# Patient Record
Sex: Female | Born: 1935 | Race: White | Hispanic: No | Smoking: Never smoker
Health system: Southern US, Community
[De-identification: ages and names within clinical notes are randomized; demographics above are authoritative.]

## PROBLEM LIST (undated history)

## (undated) DIAGNOSIS — C50919 Malignant neoplasm of unspecified site of unspecified female breast: Secondary | ICD-10-CM

## (undated) DIAGNOSIS — I10 Essential (primary) hypertension: Secondary | ICD-10-CM

## (undated) HISTORY — PX: BREAST LUMPECTOMY: SHX2

---

## 1978-09-04 HISTORY — PX: MASTECTOMY: SHX3

## 1998-01-18 ENCOUNTER — Other Ambulatory Visit: Admission: RE | Admit: 1998-01-18 | Discharge: 1998-01-18 | Payer: Self-pay | Admitting: Family Medicine

## 2005-02-22 ENCOUNTER — Ambulatory Visit: Payer: Self-pay | Admitting: Internal Medicine

## 2005-04-05 ENCOUNTER — Other Ambulatory Visit: Payer: Self-pay

## 2005-04-05 ENCOUNTER — Inpatient Hospital Stay: Payer: Self-pay | Admitting: Internal Medicine

## 2005-04-06 ENCOUNTER — Other Ambulatory Visit: Payer: Self-pay

## 2006-03-12 ENCOUNTER — Ambulatory Visit: Payer: Self-pay | Admitting: Internal Medicine

## 2007-02-27 ENCOUNTER — Ambulatory Visit: Payer: Self-pay | Admitting: Rheumatology

## 2007-05-29 ENCOUNTER — Ambulatory Visit: Payer: Self-pay | Admitting: Internal Medicine

## 2008-05-18 ENCOUNTER — Ambulatory Visit: Payer: Self-pay | Admitting: Internal Medicine

## 2010-06-01 ENCOUNTER — Emergency Department: Payer: Self-pay | Admitting: Emergency Medicine

## 2010-11-10 ENCOUNTER — Emergency Department: Payer: Self-pay | Admitting: Emergency Medicine

## 2011-12-05 ENCOUNTER — Ambulatory Visit: Payer: Self-pay | Admitting: Internal Medicine

## 2012-12-24 ENCOUNTER — Emergency Department: Payer: Self-pay | Admitting: Emergency Medicine

## 2017-07-30 ENCOUNTER — Emergency Department: Payer: Medicare Other

## 2017-07-30 ENCOUNTER — Encounter: Payer: Self-pay | Admitting: Emergency Medicine

## 2017-07-30 ENCOUNTER — Inpatient Hospital Stay
Admission: EM | Admit: 2017-07-30 | Discharge: 2017-08-03 | DRG: 871 | Disposition: A | Discharge status: Skilled Nursing Facility | Payer: Medicare Other | Attending: Internal Medicine | Admitting: Internal Medicine

## 2017-07-30 DIAGNOSIS — J189 Pneumonia, unspecified organism: Secondary | ICD-10-CM | POA: Diagnosis present

## 2017-07-30 DIAGNOSIS — I248 Other forms of acute ischemic heart disease: Secondary | ICD-10-CM | POA: Diagnosis present

## 2017-07-30 DIAGNOSIS — I10 Essential (primary) hypertension: Secondary | ICD-10-CM | POA: Diagnosis present

## 2017-07-30 DIAGNOSIS — R410 Disorientation, unspecified: Secondary | ICD-10-CM | POA: Diagnosis not present

## 2017-07-30 DIAGNOSIS — E039 Hypothyroidism, unspecified: Secondary | ICD-10-CM | POA: Diagnosis present

## 2017-07-30 DIAGNOSIS — A419 Sepsis, unspecified organism: Secondary | ICD-10-CM | POA: Diagnosis not present

## 2017-07-30 DIAGNOSIS — J90 Pleural effusion, not elsewhere classified: Secondary | ICD-10-CM | POA: Diagnosis present

## 2017-07-30 DIAGNOSIS — Z515 Encounter for palliative care: Secondary | ICD-10-CM | POA: Diagnosis not present

## 2017-07-30 DIAGNOSIS — K219 Gastro-esophageal reflux disease without esophagitis: Secondary | ICD-10-CM | POA: Diagnosis present

## 2017-07-30 DIAGNOSIS — E871 Hypo-osmolality and hyponatremia: Secondary | ICD-10-CM | POA: Diagnosis present

## 2017-07-30 DIAGNOSIS — F419 Anxiety disorder, unspecified: Secondary | ICD-10-CM | POA: Diagnosis present

## 2017-07-30 DIAGNOSIS — J9601 Acute respiratory failure with hypoxia: Secondary | ICD-10-CM | POA: Diagnosis not present

## 2017-07-30 DIAGNOSIS — Z7982 Long term (current) use of aspirin: Secondary | ICD-10-CM

## 2017-07-30 DIAGNOSIS — Z6824 Body mass index (BMI) 24.0-24.9, adult: Secondary | ICD-10-CM

## 2017-07-30 DIAGNOSIS — R443 Hallucinations, unspecified: Secondary | ICD-10-CM | POA: Diagnosis not present

## 2017-07-30 DIAGNOSIS — Z853 Personal history of malignant neoplasm of breast: Secondary | ICD-10-CM

## 2017-07-30 DIAGNOSIS — Z7189 Other specified counseling: Secondary | ICD-10-CM

## 2017-07-30 DIAGNOSIS — Y95 Nosocomial condition: Secondary | ICD-10-CM | POA: Diagnosis present

## 2017-07-30 DIAGNOSIS — E43 Unspecified severe protein-calorie malnutrition: Secondary | ICD-10-CM | POA: Diagnosis present

## 2017-07-30 DIAGNOSIS — Z79899 Other long term (current) drug therapy: Secondary | ICD-10-CM

## 2017-07-30 DIAGNOSIS — Z66 Do not resuscitate: Secondary | ICD-10-CM | POA: Diagnosis present

## 2017-07-30 HISTORY — DX: Essential (primary) hypertension: I10

## 2017-07-30 HISTORY — DX: Malignant neoplasm of unspecified site of unspecified female breast: C50.919

## 2017-07-30 LAB — BASIC METABOLIC PANEL
Anion gap: 11 (ref 5–15)
BUN: 26 mg/dL — AB (ref 6–20)
CHLORIDE: 95 mmol/L — AB (ref 101–111)
CO2: 28 mmol/L (ref 22–32)
CREATININE: 1.17 mg/dL — AB (ref 0.44–1.00)
Calcium: 8.6 mg/dL — ABNORMAL LOW (ref 8.9–10.3)
GFR calc Af Amer: 44 mL/min — ABNORMAL LOW (ref >60)
GFR calc non Af Amer: 38 mL/min — ABNORMAL LOW (ref >60)
Glucose, Bld: 102 mg/dL — ABNORMAL HIGH (ref 65–99)
Potassium: 4.5 mmol/L (ref 3.5–5.1)
Sodium: 134 mmol/L — ABNORMAL LOW (ref 135–145)

## 2017-07-30 LAB — BLOOD GAS, VENOUS
ACID-BASE EXCESS: 3.2 mmol/L — AB (ref 0.0–2.0)
Bicarbonate: 29.5 mmol/L — ABNORMAL HIGH (ref 20.0–28.0)
O2 SAT: 74.5 %
PCO2 VEN: 51 mmHg (ref 44.0–60.0)
PH VEN: 7.37 (ref 7.250–7.430)
PO2 VEN: 41 mmHg (ref 32.0–45.0)
Patient temperature: 37

## 2017-07-30 LAB — TROPONIN I
TROPONIN I: 0.04 ng/mL — AB (ref <0.03)
Troponin I: 0.05 ng/mL (ref <0.03)

## 2017-07-30 LAB — CBC
HEMATOCRIT: 34.8 % — AB (ref 35.0–47.0)
HEMOGLOBIN: 11.8 g/dL — AB (ref 12.0–16.0)
MCH: 30.5 pg (ref 26.0–34.0)
MCHC: 34 g/dL (ref 32.0–36.0)
MCV: 89.7 fL (ref 80.0–100.0)
Platelets: 221 10*3/uL (ref 150–440)
RBC: 3.87 MIL/uL (ref 3.80–5.20)
RDW: 14.4 % (ref 11.5–14.5)
WBC: 16.7 10*3/uL — ABNORMAL HIGH (ref 3.6–11.0)

## 2017-07-30 LAB — LACTIC ACID, PLASMA: Lactic Acid, Venous: 1.1 mmol/L (ref 0.5–1.9)

## 2017-07-30 MED ORDER — SODIUM CHLORIDE 0.9 % IV BOLUS (SEPSIS)
1000.0000 mL | Freq: Once | INTRAVENOUS | Status: AC
  Administered 2017-07-30: 1000 mL via INTRAVENOUS

## 2017-07-30 MED ORDER — DEXTROSE 5 % IV SOLN
2.0000 g | Freq: Once | INTRAVENOUS | Status: AC
  Administered 2017-07-30: 2 g via INTRAVENOUS
  Filled 2017-07-30: qty 2

## 2017-07-30 MED ORDER — DEXTROSE 5 % IV SOLN
1.0000 g | INTRAVENOUS | Status: DC
  Administered 2017-07-31 – 2017-08-02 (×2): 1 g via INTRAVENOUS
  Filled 2017-07-30 (×4): qty 1

## 2017-07-30 MED ORDER — VANCOMYCIN HCL IN DEXTROSE 1-5 GM/200ML-% IV SOLN
1000.0000 mg | Freq: Once | INTRAVENOUS | Status: AC
  Administered 2017-07-30: 1000 mg via INTRAVENOUS
  Filled 2017-07-30: qty 200

## 2017-07-30 MED ORDER — IOPAMIDOL (ISOVUE-370) INJECTION 76%
60.0000 mL | Freq: Once | INTRAVENOUS | Status: AC | PRN
  Administered 2017-07-30: 60 mL via INTRAVENOUS

## 2017-07-30 MED ORDER — VANCOMYCIN HCL IN DEXTROSE 1-5 GM/200ML-% IV SOLN: 1000.0000 mg | INTRAVENOUS | Status: DC

## 2017-07-30 NOTE — ED Notes (Signed)
Patient transported to CT 

## 2017-07-30 NOTE — ED Triage Notes (Signed)
Pt to ED via POV from home with c/o increased SOB, pt treated for pneumonia x2wks ago, completed antibiotics last week , follow up with PCP states "looked better". Pt states SOB with acitivity , Pt A&Ox4, 93% on RA

## 2017-07-30 NOTE — Progress Notes (Signed)
ANTIBIOTIC CONSULT NOTE - INITIAL  Pharmacy Consult for vancomycin/cefepime Indication: HCAP  No Known Allergies  Patient Measurements: Height: 4\' 11"  (149.9 cm) Weight: 118 lb (53.5 kg) IBW/kg (Calculated) : 43.2 Adjusted Body Weight:   Vital Signs: Temp: 97.6 F (36.4 C) (11/26 1801) Temp Source: Oral (11/26 1801) BP: 146/64 (11/26 2134) Pulse Rate: 73 (11/26 2145) Intake/Output from previous day: No intake/output data recorded. Intake/Output from this shift: No intake/output data recorded.  Labs: Recent Labs    07/30/17 1805 07/30/17 1900  WBC 16.7*  --   HGB 11.8*  --   PLT 221  --   CREATININE  --  1.17*   Estimated Creatinine Clearance: 20.5 mL/min (A) (by C-G formula based on SCr of 1.17 mg/dL (H)). No results for input(s): VANCOTROUGH, VANCOPEAK, VANCORANDOM, GENTTROUGH, GENTPEAK, GENTRANDOM, TOBRATROUGH, TOBRAPEAK, TOBRARND, AMIKACINPEAK, AMIKACINTROU, AMIKACIN in the last 72 hours.   Microbiology: No results found for this or any previous visit (from the past 720 hour(s)).  Medical History: Past Medical History:  Diagnosis Date  . Breast cancer ([place])   . Hypertension     Medications:  Infusions:  . [START ON 07/31/2017] ceFEPime (MAXIPIME) IV    . ceFEPime (MAXIPIME) IV    . vancomycin    . [START ON 08/01/2017] vancomycin     Assessment: 89 yof presents to ED with SOB, treated for PNA 2 weeks ago. Decreased O2, 93% on RA, then dropped to 85% O2 started. Pharmacy consulted to dose cefepime and vancomycin for HCAP.   Goal of Therapy:  Vancomycin 15 to 20 mcg/mL Resolve infection Prevent ADE  Plan:  1. Cefepime 2 gm IV x 1 in ED followed by cefepime 1 gm IV Q24H 2. Vancomycin 1 gm (approximately 20 mg/kg) IV x 1 followed by vancomycin 1 gm IV Q48H, predicted trough 17 mcg/mL. Pharmacy will continue to follow and adjust as needed to maintain trough 15 to 20 mcg/mL.   Vd 32.9 L , Ke 0.021 hr-1, T1/2 32.5 hr  Laural Benes, Pharm.D.,  BCPS Clinical Pharmacist 07/30/2017,10:21 PM

## 2017-07-30 NOTE — ED Notes (Signed)
ED Provider at bedside. 

## 2017-07-30 NOTE — ED Provider Notes (Signed)
[facility] Emergency Department Provider Note  ____________________________________________  Time seen: Approximately 11:27 PM  I have reviewed the triage vital signs and the nursing notes.   HISTORY  Chief Complaint Shortness of Breath    HPI Melissa Savage is a 83 y.o. female brought to the ED due to shortness of breath for the past 2 weeks. She was recently treated by her PCP for pneumonia, but has not improved and seems to be getting worse. No fevers or chills. Denies chest pain. Decreased oral intake for the past few days. No aggravating or alleviating factors, moderate severity. Constant symptoms.     Past Medical History:  Diagnosis Date  . Breast cancer ([place])   . Hypertension      There are no active problems to display for this patient.    History reviewed. No pertinent surgical history.   Prior to Admission medications   Medication Sig Start Date End Date Taking? Authorizing Provider  aspirin EC 81 MG tablet Take 1 tablet by mouth daily.   Yes [provider]  furosemide (LASIX) 80 MG tablet Take 1 tablet by mouth daily. 05/04/17  Yes [provider]  levothyroxine (SYNTHROID, LEVOTHROID) 112 MCG tablet Take 1 tablet by mouth daily. 05/04/17  Yes [provider]  losartan (COZAAR) 100 MG tablet Take 1 tablet by mouth daily. 05/04/17  Yes [provider]  metoprolol tartrate (LOPRESSOR) 25 MG tablet Take 1 tablet by mouth 2 (two) times daily. 05/04/17  Yes [provider]  traMADol (ULTRAM) 50 MG tablet Take 1 tablet by mouth 2 (two) times daily as needed. 11/16/16  Yes [provider]  gabapentin (NEURONTIN) 100 MG capsule Take 1 capsule by mouth 3 (three) times daily. 05/04/17   [provider]     Allergies Patient has no known allergies.   No family history on file.  Social History Social History   Tobacco Use  . Smoking status: Never Smoker  . Smokeless tobacco:  Never Used  Substance Use Topics  . Alcohol use: No    Frequency: Never  . Drug use: No    Review of Systems  Constitutional:   No fever or chills.  ENT:   No sore throat. No rhinorrhea. Cardiovascular:   No chest pain or syncope. Respiratory:   Positive shortness of breath without cough. Gastrointestinal:   Negative for abdominal pain, vomiting and diarrhea.  Musculoskeletal:   Negative for focal pain or swelling All other systems reviewed and are negative except as documented above in ROS and HPI.  ____________________________________________   PHYSICAL EXAM:  VITAL SIGNS: ED Triage Vitals  Enc Vitals Group     BP 07/30/17 1803 (!) 99/50     Pulse Rate 07/30/17 1801 (!) 51     Resp 07/30/17 1801 16     Temp 07/30/17 1801 97.6 F (36.4 C)     Temp Source 07/30/17 1801 Oral     SpO2 07/30/17 1801 93 %     Weight 07/30/17 1801 118 lb (53.5 kg)     Height 07/30/17 1801 4\' 11"  (1.499 m)     Head Circumference --      Peak Flow --      Pain Score 07/30/17 1801 0     Pain Loc --      Pain Edu? --      Excl. in GC? --   Oxygen saturation 88% on room air on my exam  Vital signs reviewed, nursing assessments reviewed.  Constitutional:   Alert and oriented. Not in distress. Eyes:   No scleral icterus.  EOMI. No nystagmus. No conjunctival pallor. PERRL. ENT   Head:   Normocephalic and atraumatic.   Nose:   No congestion/rhinnorhea.    Mouth/Throat:   Dry mucous membranes, no pharyngeal erythema. No peritonsillar mass.    Neck:   No meningismus. Full ROM. Hematological/Lymphatic/Immunilogical:   No cervical lymphadenopathy. Cardiovascular:   RRR. Symmetric bilateral radial and DP pulses.  No murmurs.  Respiratory:   Normal respiratory effort without tachypnea/retractions. Diminished breath sounds bilateral bases. No wheezes Gastrointestinal:   Soft and nontender. Non distended. There is no CVA tenderness.  No rebound, rigidity, or guarding. Genitourinary:    deferred Musculoskeletal:   Normal range of motion in all extremities. No joint effusions.  No lower extremity tenderness.  No edema. Neurologic:   Normal speech and language.  Motor grossly intact. No gross focal neurologic deficits are appreciated.  Skin:    Skin is warm, dry and intact. No rash noted.  No petechiae, purpura, or bullae.  ____________________________________________    LABS (pertinent positives/negatives) (all labs ordered are listed, but only abnormal results are displayed) Labs Reviewed  CBC - Abnormal; Notable for the following components:      Result Value   WBC 16.7 (*)    Hemoglobin 11.8 (*)    HCT 34.8 (*)    All other components within normal limits  BASIC METABOLIC PANEL - Abnormal; Notable for the following components:   Sodium 134 (*)    Chloride 95 (*)    Glucose, Bld 102 (*)    BUN 26 (*)    Creatinine, Ser 1.17 (*)    Calcium 8.6 (*)    GFR calc non Af Amer 38 (*)    GFR calc Af Amer 44 (*)    All other components within normal limits  TROPONIN I - Abnormal; Notable for the following components:   Troponin I 0.05 (*)    All other components within normal limits  TROPONIN I - Abnormal; Notable for the following components:   Troponin I 0.04 (*)    All other components within normal limits  BLOOD GAS, VENOUS - Abnormal; Notable for the following components:   Bicarbonate 29.5 (*)    Acid-Base Excess 3.2 (*)    All other components within normal limits  CULTURE, BLOOD (ROUTINE X 2)  CULTURE, BLOOD (ROUTINE X 2)  LACTIC ACID, PLASMA  LACTIC ACID, PLASMA   ____________________________________________   EKG  Interpreted by me Sinus bradycardia rate 51, normal axis and intervals. Normal QRS ST segments and T waves  ____________________________________________    RADIOLOGY  Dg Chest 2 View  Result Date: 07/30/2017 CLINICAL DATA:  Acute onset shortness of breath EXAM: CHEST  2 VIEW COMPARISON:  Report 07/16/2017, 09/08/2003; the  images were unable to be retrieved at the time of dictation and an addendum will be issued when the priors are available for comparison FINDINGS: Coarse bilateral right greater than left interstitial and alveolar opacity. No pleural effusion. Mild cardiomegaly with aortic atherosclerosis. No pneumothorax. Degenerative changes of the spine. IMPRESSION: 1. Coarse bilateral right greater than left interstitial and alveolar opacity, may reflect acute inflammatory or infectious process on underlying chronic changes. 2. Mild cardiomegaly Electronically Signed   By: Donavan Foil M.D.   On: 07/30/2017 20:24   Ct Angio Chest Pe W And/or Wo Contrast  Result Date: 07/30/2017 CLINICAL DATA:  83 y/o F; treated for pneumonia 2 weeks ago. Completed  antibiotics. Persistent shortness of breath. PE suspected, high pretest probability. History of breast cancer. EXAM: CT ANGIOGRAPHY CHEST WITH CONTRAST TECHNIQUE: Multidetector CT imaging of the chest was performed using the standard protocol during bolus administration of intravenous contrast. Multiplanar CT image reconstructions and MIPs were obtained to evaluate the vascular anatomy. CONTRAST:  36mL ISOVUE-370 IOPAMIDOL (ISOVUE-370) INJECTION 76% COMPARISON:  07/30/2017 chest radiograph. FINDINGS: Cardiovascular: Normal caliber thoracic aorta. Moderate calcific atherosclerosis of the aorta. Mild coronary artery calcification dense aortic valvular and mitral annular calcifications. Mild cardiomegaly.  No pericardial effusion. Satisfactory opacification of the main pulmonary artery. Normal caliber main pulmonary artery. No pulmonary embolus. Mediastinum/Nodes: Multinodular thyroid goiter with calcified nodule in left lobe of thyroid measuring up to 3.3 cm. Lungs/Pleura: Diffuse multifocal pneumonia with dense consolidation in the right upper lobe. Smooth interlobular septal thickening probably represents superimposed interstitial pulmonary edema. Moderate right and small left  pleural effusions. Upper Abdomen: No acute abnormality. Musculoskeletal: Paraspinal muscle calcifications, likely disuse atrophy. Right mastectomy. No acute osseous abnormality identified. Review of the MIP images confirms the above findings. IMPRESSION: 1. No pulmonary embolus identified. 2. Multifocal pneumonia with dense right upper lobe consolidation. 3. Interstitial pulmonary edema. 4. Moderate right and small left pleural effusions. 5. Dense aortic and mitral valvular calcifications can be associated with valvular dysfunction. 6. Moderate cardiomegaly. Mild aortic and coronary artery calcific atherosclerosis. 7. Multinodular goiter. Electronically Signed   By: Kristine Garbe M.D.   On: 07/30/2017 21:30    ____________________________________________   PROCEDURES Procedures  ____________________________________________   DIFFERENTIAL DIAGNOSIS  PE, pneumothorax, pneumonia, pericarditis  CLINICAL IMPRESSION / ASSESSMENT AND PLAN / ED COURSE  Pertinent labs & imaging results that were available during my care of the patient were reviewed by me and considered in my medical decision making (see chart for details).   Patient presents with hypoxia, worsening shortness of breath and weakness despite recent treatment of pneumonia outpatient. Loud systolic murmur also raises suspicion of PE. We'll obtain CT scan given nondiagnostic chest x-ray, check labs. Patient strongly prefers to go home, but discussed with her her hypoxia and elevated troponin and we'll revisit the issue after workup is complete  Clinical Course as of Jul 30 2326  Mon Jul 30, 2017  2213 CT neg for PE, reveals multifocal pna. With persistent hypoxia, will tx for hcap, admit.   [PS]    Clinical Course User Index [PS] Carrie Mew, MD     ----------------------------------------- 11:30 PM on 07/30/2017 -----------------------------------------  Persistent hypoxia, 85% on room air. CT scan shows  multifocal pneumonia with dense consolidation of right upper lobe. Patient will need to be admitted for further management given failed outpatient treatment and worsening of her condition. Case discussed with hospitalist.  ____________________________________________   FINAL CLINICAL IMPRESSION(S) / ED DIAGNOSES    Final diagnoses:  HCAP (healthcare-associated pneumonia)  Acute respiratory failure with hypoxia ([place])      This SmartLink is deprecated. Use AVSMEDLIST instead to display the medication list for a patient.   Portions of this note were generated with dragon dictation software. Dictation errors may occur despite best attempts at proofreading.    Carrie Mew, MD 07/30/17 [phone]

## 2017-07-30 NOTE — ED Notes (Signed)
Patient's oxygen saturation dropped to 85% on RA. MD informed. Patient placed on 2L [place]. RN will continue to monitor.

## 2017-07-31 ENCOUNTER — Other Ambulatory Visit: Payer: Self-pay

## 2017-07-31 ENCOUNTER — Encounter: Payer: Self-pay | Admitting: Internal Medicine

## 2017-07-31 DIAGNOSIS — F419 Anxiety disorder, unspecified: Secondary | ICD-10-CM | POA: Diagnosis present

## 2017-07-31 DIAGNOSIS — K219 Gastro-esophageal reflux disease without esophagitis: Secondary | ICD-10-CM | POA: Diagnosis present

## 2017-07-31 DIAGNOSIS — J9601 Acute respiratory failure with hypoxia: Secondary | ICD-10-CM | POA: Diagnosis not present

## 2017-07-31 DIAGNOSIS — J189 Pneumonia, unspecified organism: Secondary | ICD-10-CM | POA: Diagnosis present

## 2017-07-31 DIAGNOSIS — R443 Hallucinations, unspecified: Secondary | ICD-10-CM | POA: Diagnosis not present

## 2017-07-31 DIAGNOSIS — E871 Hypo-osmolality and hyponatremia: Secondary | ICD-10-CM | POA: Diagnosis present

## 2017-07-31 DIAGNOSIS — Y95 Nosocomial condition: Secondary | ICD-10-CM | POA: Diagnosis present

## 2017-07-31 DIAGNOSIS — R410 Disorientation, unspecified: Secondary | ICD-10-CM | POA: Diagnosis not present

## 2017-07-31 DIAGNOSIS — Z7189 Other specified counseling: Secondary | ICD-10-CM | POA: Diagnosis not present

## 2017-07-31 DIAGNOSIS — Z66 Do not resuscitate: Secondary | ICD-10-CM | POA: Diagnosis present

## 2017-07-31 DIAGNOSIS — Z515 Encounter for palliative care: Secondary | ICD-10-CM | POA: Diagnosis not present

## 2017-07-31 DIAGNOSIS — Z7982 Long term (current) use of aspirin: Secondary | ICD-10-CM | POA: Diagnosis not present

## 2017-07-31 DIAGNOSIS — A419 Sepsis, unspecified organism: Secondary | ICD-10-CM | POA: Diagnosis present

## 2017-07-31 DIAGNOSIS — Z853 Personal history of malignant neoplasm of breast: Secondary | ICD-10-CM | POA: Diagnosis not present

## 2017-07-31 DIAGNOSIS — Z79899 Other long term (current) drug therapy: Secondary | ICD-10-CM | POA: Diagnosis not present

## 2017-07-31 DIAGNOSIS — I248 Other forms of acute ischemic heart disease: Secondary | ICD-10-CM | POA: Diagnosis present

## 2017-07-31 DIAGNOSIS — E039 Hypothyroidism, unspecified: Secondary | ICD-10-CM | POA: Diagnosis present

## 2017-07-31 DIAGNOSIS — J90 Pleural effusion, not elsewhere classified: Secondary | ICD-10-CM | POA: Diagnosis present

## 2017-07-31 DIAGNOSIS — I10 Essential (primary) hypertension: Secondary | ICD-10-CM | POA: Diagnosis present

## 2017-07-31 DIAGNOSIS — E43 Unspecified severe protein-calorie malnutrition: Secondary | ICD-10-CM | POA: Diagnosis present

## 2017-07-31 DIAGNOSIS — Z6824 Body mass index (BMI) 24.0-24.9, adult: Secondary | ICD-10-CM | POA: Diagnosis not present

## 2017-07-31 LAB — BASIC METABOLIC PANEL
Anion gap: 10 (ref 5–15)
BUN: 21 mg/dL — AB (ref 6–20)
CHLORIDE: 99 mmol/L — AB (ref 101–111)
CO2: 26 mmol/L (ref 22–32)
CREATININE: 0.96 mg/dL (ref 0.44–1.00)
Calcium: 8 mg/dL — ABNORMAL LOW (ref 8.9–10.3)
GFR calc Af Amer: 56 mL/min — ABNORMAL LOW (ref >60)
GFR calc non Af Amer: 48 mL/min — ABNORMAL LOW (ref >60)
GLUCOSE: 94 mg/dL (ref 65–99)
Potassium: 3.8 mmol/L (ref 3.5–5.1)
Sodium: 135 mmol/L (ref 135–145)

## 2017-07-31 LAB — TROPONIN I
Troponin I: 0.06 ng/mL (ref <0.03)
Troponin I: 0.06 ng/mL (ref <0.03)
Troponin I: 0.06 ng/mL (ref <0.03)

## 2017-07-31 LAB — CBC
HCT: 37.4 % (ref 35.0–47.0)
Hemoglobin: 12.5 g/dL (ref 12.0–16.0)
MCH: 30.8 pg (ref 26.0–34.0)
MCHC: 33.6 g/dL (ref 32.0–36.0)
MCV: 91.8 fL (ref 80.0–100.0)
Platelets: 194 10*3/uL (ref 150–440)
RBC: 4.07 MIL/uL (ref 3.80–5.20)
RDW: 14.5 % (ref 11.5–14.5)
WBC: 15.1 10*3/uL — ABNORMAL HIGH (ref 3.6–11.0)

## 2017-07-31 LAB — MRSA PCR SCREENING: MRSA by PCR: NEGATIVE

## 2017-07-31 MED ORDER — FUROSEMIDE 10 MG/ML IJ SOLN
10.0000 mg | Freq: Once | INTRAMUSCULAR | Status: AC
  Administered 2017-07-31: 10 mg via INTRAVENOUS
  Filled 2017-07-31: qty 2

## 2017-07-31 MED ORDER — SODIUM CHLORIDE 0.9% FLUSH: 3.0000 mL | INTRAVENOUS | Status: DC | PRN

## 2017-07-31 MED ORDER — PNEUMOCOCCAL VAC POLYVALENT 25 MCG/0.5ML IJ INJ: 0.5000 mL | INJECTION | INTRAMUSCULAR | Status: DC

## 2017-07-31 MED ORDER — MORPHINE SULFATE (PF) 2 MG/ML IV SOLN
0.5000 mg | Freq: Once | INTRAVENOUS | Status: AC
  Administered 2017-07-31: 0.5 mg via INTRAVENOUS
  Filled 2017-07-31: qty 1

## 2017-07-31 MED ORDER — METHYLPREDNISOLONE SODIUM SUCC 125 MG IJ SOLR
60.0000 mg | Freq: Once | INTRAMUSCULAR | Status: AC
  Administered 2017-07-31: 60 mg via INTRAVENOUS
  Filled 2017-07-31: qty 2

## 2017-07-31 MED ORDER — ENOXAPARIN SODIUM 30 MG/0.3ML ~~LOC~~ SOLN
30.0000 mg | SUBCUTANEOUS | Status: DC
  Administered 2017-07-31 – 2017-08-01 (×2): 30 mg via SUBCUTANEOUS
  Filled 2017-07-31 (×2): qty 0.3

## 2017-07-31 MED ORDER — IPRATROPIUM-ALBUTEROL 0.5-2.5 (3) MG/3ML IN SOLN
3.0000 mL | RESPIRATORY_TRACT | Status: DC | PRN
  Administered 2017-07-31 – 2017-08-02 (×5): 3 mL via RESPIRATORY_TRACT
  Filled 2017-07-31 (×5): qty 3

## 2017-07-31 MED ORDER — LEVOTHYROXINE SODIUM 112 MCG PO TABS
112.0000 ug | ORAL_TABLET | Freq: Every day | ORAL | Status: DC
  Administered 2017-07-31 – 2017-08-03 (×4): 112 ug via ORAL
  Filled 2017-07-31 (×4): qty 1

## 2017-07-31 MED ORDER — ASPIRIN EC 81 MG PO TBEC
81.0000 mg | DELAYED_RELEASE_TABLET | Freq: Every day | ORAL | Status: DC
  Administered 2017-07-31 – 2017-08-02 (×3): 81 mg via ORAL
  Filled 2017-07-31 (×3): qty 1

## 2017-07-31 MED ORDER — ENOXAPARIN SODIUM 40 MG/0.4ML ~~LOC~~ SOLN: 40.0000 mg | SUBCUTANEOUS | Status: DC

## 2017-07-31 MED ORDER — LOSARTAN POTASSIUM 50 MG PO TABS
100.0000 mg | ORAL_TABLET | Freq: Every day | ORAL | Status: DC
  Administered 2017-07-31 – 2017-08-02 (×3): 100 mg via ORAL
  Filled 2017-07-31 (×3): qty 2

## 2017-07-31 MED ORDER — ONDANSETRON HCL 4 MG PO TABS: 4.0000 mg | ORAL_TABLET | Freq: Four times a day (QID) | ORAL | Status: DC | PRN

## 2017-07-31 MED ORDER — SENNOSIDES-DOCUSATE SODIUM 8.6-50 MG PO TABS: 1.0000 | ORAL_TABLET | Freq: Every evening | ORAL | Status: DC | PRN

## 2017-07-31 MED ORDER — ENSURE ENLIVE PO LIQD
237.0000 mL | Freq: Two times a day (BID) | ORAL | Status: DC
  Administered 2017-08-01 (×2): 237 mL via ORAL

## 2017-07-31 MED ORDER — ACETAMINOPHEN 325 MG PO TABS: 650.0000 mg | ORAL_TABLET | Freq: Four times a day (QID) | ORAL | Status: DC | PRN

## 2017-07-31 MED ORDER — SODIUM CHLORIDE 0.9% FLUSH
3.0000 mL | Freq: Two times a day (BID) | INTRAVENOUS | Status: DC
  Administered 2017-07-31 – 2017-08-02 (×6): 3 mL via INTRAVENOUS

## 2017-07-31 MED ORDER — ADULT MULTIVITAMIN W/MINERALS CH
1.0000 | ORAL_TABLET | Freq: Every day | ORAL | Status: DC
  Administered 2017-08-01 – 2017-08-02 (×2): 1 via ORAL
  Filled 2017-07-31 (×2): qty 1

## 2017-07-31 MED ORDER — ACETAMINOPHEN 650 MG RE SUPP: 650.0000 mg | Freq: Four times a day (QID) | RECTAL | Status: DC | PRN

## 2017-07-31 MED ORDER — SODIUM CHLORIDE 0.9 % IV SOLN: 250.0000 mL | INTRAVENOUS | Status: DC | PRN

## 2017-07-31 MED ORDER — ONDANSETRON HCL 4 MG/2ML IJ SOLN: 4.0000 mg | Freq: Four times a day (QID) | INTRAMUSCULAR | Status: DC | PRN

## 2017-07-31 MED ORDER — LORAZEPAM 1 MG PO TABS
1.0000 mg | ORAL_TABLET | ORAL | Status: DC | PRN
  Administered 2017-07-31 – 2017-08-01 (×3): 1 mg via ORAL
  Filled 2017-07-31 (×3): qty 1

## 2017-07-31 MED ORDER — GABAPENTIN 100 MG PO CAPS
100.0000 mg | ORAL_CAPSULE | Freq: Three times a day (TID) | ORAL | Status: DC
  Administered 2017-07-31 – 2017-08-03 (×10): 100 mg via ORAL
  Filled 2017-07-31 (×10): qty 1

## 2017-07-31 MED ORDER — METOPROLOL TARTRATE 25 MG PO TABS
25.0000 mg | ORAL_TABLET | Freq: Two times a day (BID) | ORAL | Status: DC
  Administered 2017-07-31 – 2017-08-02 (×6): 25 mg via ORAL
  Filled 2017-07-31 (×6): qty 1

## 2017-07-31 MED ORDER — TRAMADOL HCL 50 MG PO TABS
50.0000 mg | ORAL_TABLET | Freq: Two times a day (BID) | ORAL | Status: DC | PRN
  Filled 2017-07-31 (×2): qty 1

## 2017-07-31 NOTE — Progress Notes (Signed)
Initial Nutrition Assessment  DOCUMENTATION CODES:   Severe malnutrition in context of chronic illness  INTERVENTION:  Provide Ensure Enlive po BID, each supplement provides 350 kcal and 20 grams of protein.  Provide multivitamin with minerals daily.  NUTRITION DIAGNOSIS:   Severe Malnutrition related to social / environmental circumstances(advanced age, inadequate intake of calories and protein) as evidenced by severe fat depletion, severe muscle depletion.  GOAL:   Patient will meet greater than or equal to 90% of their needs  MONITOR:   PO intake, Supplement acceptance, Labs, Weight trends, Skin, I & O's  REASON FOR ASSESSMENT:   Malnutrition Screening Tool, Consult Assessment of nutrition requirement/status, Poor PO  ASSESSMENT:   83 year old female with PMHx of breast cancer s/p right mastectomy in 1980, HTN who presented with SOB found to have PNA.   Met with patient and her family members at bedside. Patient reports she has had a decreased appetite for the past day due to SOB. She reports she typically has a good appetite. She eats 3 small meals per day. However, she reports she does not eat a lot of meat, beans, or dairy so has very limited intake of protein. She mainly eats small amounts of fruits and vegetables. Had a bottle of Ensure today and enjoyed it. She is amenable to drinking Ensure daily to help meet calorie/protein needs.  Patient unsure of her UBW but believes she has been losing weight.  Meal Completion: 50%  Medications reviewed and include: levothyroxine, cefepime.  Labs reviewed: Chloride 99, BUN 21.  NUTRITION - FOCUSED PHYSICAL EXAM:    Most Recent Value  Orbital Region  Severe depletion  Upper Arm Region  Severe depletion  Thoracic and Lumbar Region  Severe depletion  Buccal Region  Severe depletion  Temple Region  Severe depletion  Clavicle Bone Region  Severe depletion  Clavicle and Acromion Bone Region  Severe depletion  Scapular Bone  Region  Severe depletion  Dorsal Hand  Severe depletion  Patellar Region  Severe depletion  Anterior Thigh Region  Severe depletion  Posterior Calf Region  Severe depletion  Edema (RD Assessment)  None  Hair  Reviewed  Eyes  Reviewed  Mouth  Reviewed  Skin  Reviewed  Nails  Reviewed     Diet Order:  Diet 2 gram sodium Room service appropriate? No; Fluid consistency: Thin  EDUCATION NEEDS:   No education needs have been identified at this time  Skin:  Skin Assessment: Reviewed RN Assessment  Last BM:  07/31/2017  Height:   Ht Readings from Last 1 Encounters:  07/31/17 _0  (1.499 m)    Weight:   Wt Readings from Last 1 Encounters:  07/31/17 119 lb 3.2 oz (54.1 kg)    Ideal Body Weight:  43.2 kg  BMI:  Body mass index is 24.08 kg/m.  Estimated Nutritional Needs:   Kcal:  1350-1625 (25-30 kcal/kg)  Protein:  70-80 grams (1.3-1.5 grams/kg)  Fluid:  1.3 L/day (25 mL/kg)  Willey Blade, MS, RD, LDN Office: [phone] Pager: [phone] After Hours/Weekend Pager: [phone]

## 2017-07-31 NOTE — H&P (Signed)
[facility] NAME: Melissa Savage    MR#:  [id number]  DATE OF BIRTH:  1918-03-13  DATE OF ADMISSION:  07/30/2017  PRIMARY CARE PHYSICIAN: Baxter Hire, MD   REQUESTING/REFERRING PHYSICIAN:   CHIEF COMPLAINT:   Chief Complaint  Patient presents with  . Shortness of Breath    HISTORY OF PRESENT ILLNESS: Melissa Savage  is a 83 y.o. female with a known history of breast cancer, hypertension was brought to the emergency room for shortness of breath for the last 1 week and cough. Patient was treated for pneumonia by primary care physician but the symptoms got worse. Patient felt more short of breath for the last 1 week and she was hypoxic when she came to the emergency room she was put on Lanoxin by nasal cannula. No recent travel. Patient lives alone and independent in activities of daily living. Patient does not want any cardiac resuscitation, intubation and ventilatory the need arises. Patient is DO NOT RESUSCITATE by CODE STATUS. Has generalized weakness. She was worked up with CT chest which showed no pulmonary embolism, multifocal pneumonia noted along with pleural effusions. Hospitalist service was consulted. No complaints of any chest pain, orthopnea.  PAST MEDICAL HISTORY:   Past Medical History:  Diagnosis Date  . Breast cancer ([place])   . Hypertension     PAST SURGICAL HISTORY:  Past Surgical History:  Procedure Laterality Date  . BREAST LUMPECTOMY      SOCIAL HISTORY:  Social History   Tobacco Use  . Smoking status: Never Smoker  . Smokeless tobacco: Never Used  Substance Use Topics  . Alcohol use: No    Frequency: Never    FAMILY HISTORY:  Family History  Problem Relation Age of Onset  . Diabetes Mellitus II Neg Hx   . CAD Neg Hx     DRUG ALLERGIES: No Known Allergies  REVIEW OF SYSTEMS:   CONSTITUTIONAL: No fever, has weakness.  EYES: No blurred or double vision.  EARS, NOSE, AND  THROAT: No tinnitus or ear pain.  RESPIRATORY: Has cough, shortness of breath, No wheezing or hemoptysis.  CARDIOVASCULAR: No chest pain, orthopnea, edema.  GASTROINTESTINAL: No nausea, vomiting, diarrhea or abdominal pain.  GENITOURINARY: No dysuria, hematuria.  ENDOCRINE: No polyuria, nocturia,  HEMATOLOGY: No anemia, easy bruising or bleeding SKIN: No rash or lesion. MUSCULOSKELETAL: No joint pain or arthritis.   NEUROLOGIC: No tingling, numbness, weakness.  PSYCHIATRY: No anxiety or depression.   MEDICATIONS AT HOME:  Prior to Admission medications   Medication Sig Start Date End Date Taking? Authorizing Provider  aspirin EC 81 MG tablet Take 1 tablet by mouth daily.   Yes [provider]  furosemide (LASIX) 80 MG tablet Take 1 tablet by mouth daily. 05/04/17  Yes [provider]  levothyroxine (SYNTHROID, LEVOTHROID) 112 MCG tablet Take 1 tablet by mouth daily. 05/04/17  Yes [provider]  losartan (COZAAR) 100 MG tablet Take 1 tablet by mouth daily. 05/04/17  Yes [provider]  metoprolol tartrate (LOPRESSOR) 25 MG tablet Take 1 tablet by mouth 2 (two) times daily. 05/04/17  Yes [provider]  traMADol (ULTRAM) 50 MG tablet Take 1 tablet by mouth 2 (two) times daily as needed. 11/16/16  Yes [provider]  gabapentin (NEURONTIN) 100 MG capsule Take 1 capsule by mouth 3 (three) times daily. 05/04/17   [provider]      PHYSICAL EXAMINATION:   VITAL SIGNS: Blood pressure  126/68, pulse 74, temperature 97.6 F (36.4 C), temperature source Oral, resp. rate (!) 28, height 4\' 11"  (1.499 m), weight 53.5 kg (118 lb), SpO2 96 %.  GENERAL:  83 y.o.-year-old patient lying in the bed with no acute distress.  EYES: Pupils equal, round, reactive to light and accommodation. No scleral icterus. Extraocular muscles intact.  HEENT: Head atraumatic, normocephalic. Oropharynx and nasopharynx clear.  NECK:  Supple, no jugular venous  distention. No thyroid enlargement, no tenderness.  LUNGS: Decreased breath sounds bilaterally, bilateral rales heard, scattered rhonchi in both lung fields. No use of accessory muscles of respiration.  CARDIOVASCULAR: S1, S2 normal. No murmurs, rubs, or gallops.  ABDOMEN: Soft, nontender, nondistended. Bowel sounds present. No organomegaly or mass.  EXTREMITIES: No pedal edema, cyanosis, or clubbing.  NEUROLOGIC: Cranial nerves II through XII are intact. Muscle strength 5/5 in all extremities. Sensation intact. Gait not checked.  PSYCHIATRIC: The patient is alert and oriented x 3.  SKIN: No obvious rash, lesion, or ulcer.   LABORATORY PANEL:   CBC Recent Labs  Lab 07/30/17 1805  WBC 16.7*  HGB 11.8*  HCT 34.8*  PLT 221  MCV 89.7  MCH 30.5  MCHC 34.0  RDW 14.4   ------------------------------------------------------------------------------------------------------------------  Chemistries  Recent Labs  Lab 07/30/17 1900  NA 134*  K 4.5  CL 95*  CO2 28  GLUCOSE 102*  BUN 26*  CREATININE 1.17*  CALCIUM 8.6*   ------------------------------------------------------------------------------------------------------------------ estimated creatinine clearance is 20.5 mL/min (A) (by C-G formula based on SCr of 1.17 mg/dL (H)). ------------------------------------------------------------------------------------------------------------------ No results for input(s): TSH, T4TOTAL, T3FREE, THYROIDAB in the last 72 hours.  Invalid input(s): FREET3   Coagulation profile No results for input(s): INR, PROTIME in the last 168 hours. ------------------------------------------------------------------------------------------------------------------- No results for input(s): DDIMER in the last 72 hours. -------------------------------------------------------------------------------------------------------------------  Cardiac Enzymes Recent Labs  Lab 07/30/17 1900 07/30/17 2204   TROPONINI 0.05* 0.04*   ------------------------------------------------------------------------------------------------------------------ Invalid input(s): POCBNP  ---------------------------------------------------------------------------------------------------------------  Urinalysis No results found for: COLORURINE, APPEARANCEUR, LABSPEC, PHURINE, GLUCOSEU, HGBUR, BILIRUBINUR, KETONESUR, PROTEINUR, UROBILINOGEN, NITRITE, LEUKOCYTESUR   RADIOLOGY: Dg Chest 2 View  Result Date: 07/30/2017 CLINICAL DATA:  Acute onset shortness of breath EXAM: CHEST  2 VIEW COMPARISON:  Report 07/16/2017, 09/08/2003; the images were unable to be retrieved at the time of dictation and an addendum will be issued when the priors are available for comparison FINDINGS: Coarse bilateral right greater than left interstitial and alveolar opacity. No pleural effusion. Mild cardiomegaly with aortic atherosclerosis. No pneumothorax. Degenerative changes of the spine. IMPRESSION: 1. Coarse bilateral right greater than left interstitial and alveolar opacity, may reflect acute inflammatory or infectious process on underlying chronic changes. 2. Mild cardiomegaly Electronically Signed   By: Donavan Foil M.D.   On: 07/30/2017 20:24   Ct Angio Chest Pe W And/or Wo Contrast  Result Date: 07/30/2017 CLINICAL DATA:  83 y/o F; treated for pneumonia 2 weeks ago. Completed antibiotics. Persistent shortness of breath. PE suspected, high pretest probability. History of breast cancer. EXAM: CT ANGIOGRAPHY CHEST WITH CONTRAST TECHNIQUE: Multidetector CT imaging of the chest was performed using the standard protocol during bolus administration of intravenous contrast. Multiplanar CT image reconstructions and MIPs were obtained to evaluate the vascular anatomy. CONTRAST:  24mL ISOVUE-370 IOPAMIDOL (ISOVUE-370) INJECTION 76% COMPARISON:  07/30/2017 chest radiograph. FINDINGS: Cardiovascular: Normal caliber thoracic aorta. Moderate  calcific atherosclerosis of the aorta. Mild coronary artery calcification dense aortic valvular and mitral annular calcifications. Mild cardiomegaly.  No pericardial effusion. Satisfactory opacification of the main pulmonary artery.  Normal caliber main pulmonary artery. No pulmonary embolus. Mediastinum/Nodes: Multinodular thyroid goiter with calcified nodule in left lobe of thyroid measuring up to 3.3 cm. Lungs/Pleura: Diffuse multifocal pneumonia with dense consolidation in the right upper lobe. Smooth interlobular septal thickening probably represents superimposed interstitial pulmonary edema. Moderate right and small left pleural effusions. Upper Abdomen: No acute abnormality. Musculoskeletal: Paraspinal muscle calcifications, likely disuse atrophy. Right mastectomy. No acute osseous abnormality identified. Review of the MIP images confirms the above findings. IMPRESSION: 1. No pulmonary embolus identified. 2. Multifocal pneumonia with dense right upper lobe consolidation. 3. Interstitial pulmonary edema. 4. Moderate right and small left pleural effusions. 5. Dense aortic and mitral valvular calcifications can be associated with valvular dysfunction. 6. Moderate cardiomegaly. Mild aortic and coronary artery calcific atherosclerosis. 7. Multinodular goiter. Electronically Signed   By: Kristine Garbe M.D.   On: 07/30/2017 21:30    EKG: Orders placed or performed during the hospital encounter of 07/30/17  . ED EKG  . ED EKG  . EKG 12-Lead  . EKG 12-Lead    IMPRESSION AND PLAN: 83 year old elderly female patient with history of breast cancer, hypertension presented to the emergency room with cough, shortness of breath and low oxygen saturation.  Admitting diagnosis 1. Healthcare associated pneumonia 2. Hypoxia 3. Pleural effusion 4. Hyponatremia 5. Hypertension 6. History of breast cancer Treatment plan Admit patient to medical floor Oxygen via nasal cannula Start patient on IV  vancomycin and IV cefepime antibiotic Follow-up electrolytes Follow-up cultures  All the records are reviewed and case discussed with ED provider. Management plans discussed with the patient, family and they are in agreement.  CODE STATUS:DNR Code Status History    This patient does not have a recorded code status. Please follow your organizational policy for patients in this situation.    Advance Directive Documentation     Most Recent Value  Type of Advance Directive  Living will  Pre-existing out of facility DNR order (yellow form or pink MOST form)  No data  "MOST" Form in Place?  No data       TOTAL TIME TAKING CARE OF THIS PATIENT: 55 minutes.    Saundra Shelling M.D on 07/31/2017 at 2:43 AM  Between 7am to 6pm - Pager - [phone]  After 6pm go to [email or web address] - password [email or web address] [facility]  Office  [phone]  CC: Primary care physician; Baxter Hire, MD

## 2017-07-31 NOTE — ED Notes (Signed)
Patient assisted to toilet. Patient became very dyspneic, respirations increased to 35-40 breaths per minute. Patient on oxygen via [place] at 3L the entire time. Patient's oxygen saturation decreased to 88% on 3L [place]. MD [facility] informed.

## 2017-07-31 NOTE — Progress Notes (Signed)
SLP Cancellation Note  Patient Details Name: ZYANA AMARO MRN: [id number] DOB: 10-29-1917   Cancelled treatment:       Reason Eval/Treat Not Completed: (order received; modified diet until full eval can be done). Pt admitted w/ SOB and pneumonia, sepsis per MD note; on regular diet initially per MD order. ST to see pt for BSE in the morning; aspiration precautions placed.     Orinda Kenner, [place], CCC-SLP Consandra Laske 07/31/2017, 4:43 PM

## 2017-07-31 NOTE — Progress Notes (Signed)
Lovenox changed to 30 mg daily for BMI <40 and CrCl <30 

## 2017-07-31 NOTE — Progress Notes (Signed)
[facility] [place] NAME: Melissa Savage    MR#:  [id number]  DATE OF BIRTH:  Feb 28, 1918  SUBJECTIVE:  Patient here with shortness of breath and found to have pneumonia   REVIEW OF SYSTEMS:    Review of Systems  Constitutional: Negative for fever, chills weight loss HENT: Negative for ear pain, nosebleeds, congestion, facial swelling, rhinorrhea, neck pain, neck stiffness and ear discharge.   Respiratory: Positive for cough and shortness of breath no wheezing Cardiovascular: Negative for chest pain, palpitations and leg swelling.  Gastrointestinal: Negative for heartburn, abdominal pain, vomiting, diarrhea or consitpation Genitourinary: Negative for dysuria, urgency, frequency, hematuria Musculoskeletal: Negative for back pain or joint pain Neurological: Negative for dizziness, seizures, syncope, focal weakness,  numbness and headaches.  Hematological: Does not bruise/bleed easily.  Psychiatric/Behavioral: Negative for hallucinations, confusion, dysphoric mood    Tolerating Diet: yes      DRUG ALLERGIES:  No Known Allergies  VITALS:  Blood pressure (!) 119/57, pulse (!) 45, temperature 97.6 F (36.4 C), resp. rate 18, height 4\' 11"  (1.499 m), weight 54.1 kg (119 lb 3.2 oz), SpO2 97 %.  PHYSICAL EXAMINATION:  Constitutional: Appears well-developed and well-nourished. No distress. HENT: Normocephalic. Marland Kitchen Oropharynx is clear and moist.  Eyes: Conjunctivae and EOM are normal. PERRLA, no scleral icterus.  Neck: Normal ROM. Neck supple. No JVD. No tracheal deviation. CVS: RRR, S1/S2 +, no murmurs, no gallops, no carotid bruit.  Pulmonary: Decreased breath sounds throughout lung fields no wheezing or crackles. Abdominal: Soft. BS +,  no distension, tenderness, rebound or guarding.  Musculoskeletal: Normal range of motion. No edema and no tenderness.  Neuro: Alert. CN 2-12 grossly intact. No focal deficits. Skin: Skin is warm and  dry. No rash noted. Psychiatric: Normal mood and affect.      LABORATORY PANEL:   CBC Recent Labs  Lab 07/31/17 0404  WBC 15.1*  HGB 12.5  HCT 37.4  PLT 194   ------------------------------------------------------------------------------------------------------------------  Chemistries  Recent Labs  Lab 07/31/17 0404  NA 135  K 3.8  CL 99*  CO2 26  GLUCOSE 94  BUN 21*  CREATININE 0.96  CALCIUM 8.0*   ------------------------------------------------------------------------------------------------------------------  Cardiac Enzymes Recent Labs  Lab 07/30/17 1900 07/30/17 2204 07/31/17 0404  TROPONINI 0.05* 0.04* 0.06*   ------------------------------------------------------------------------------------------------------------------  RADIOLOGY:  Dg Chest 2 View  Result Date: 07/30/2017 CLINICAL DATA:  Acute onset shortness of breath EXAM: CHEST  2 VIEW COMPARISON:  Report 07/16/2017, 09/08/2003; the images were unable to be retrieved at the time of dictation and an addendum will be issued when the priors are available for comparison FINDINGS: Coarse bilateral right greater than left interstitial and alveolar opacity. No pleural effusion. Mild cardiomegaly with aortic atherosclerosis. No pneumothorax. Degenerative changes of the spine. IMPRESSION: 1. Coarse bilateral right greater than left interstitial and alveolar opacity, may reflect acute inflammatory or infectious process on underlying chronic changes. 2. Mild cardiomegaly Electronically Signed   By: Donavan Foil M.D.   On: 07/30/2017 20:24   Ct Angio Chest Pe W And/or Wo Contrast  Result Date: 07/30/2017 CLINICAL DATA:  83 y/o F; treated for pneumonia 2 weeks ago. Completed antibiotics. Persistent shortness of breath. PE suspected, high pretest probability. History of breast cancer. EXAM: CT ANGIOGRAPHY CHEST WITH CONTRAST TECHNIQUE: Multidetector CT imaging of the chest was performed using the standard  protocol during bolus administration of intravenous contrast. Multiplanar CT image reconstructions and MIPs were obtained to evaluate the vascular anatomy. CONTRAST:  56mL ISOVUE-370 IOPAMIDOL (ISOVUE-370) INJECTION 76% COMPARISON:  07/30/2017 chest radiograph. FINDINGS: Cardiovascular: Normal caliber thoracic aorta. Moderate calcific atherosclerosis of the aorta. Mild coronary artery calcification dense aortic valvular and mitral annular calcifications. Mild cardiomegaly.  No pericardial effusion. Satisfactory opacification of the main pulmonary artery. Normal caliber main pulmonary artery. No pulmonary embolus. Mediastinum/Nodes: Multinodular thyroid goiter with calcified nodule in left lobe of thyroid measuring up to 3.3 cm. Lungs/Pleura: Diffuse multifocal pneumonia with dense consolidation in the right upper lobe. Smooth interlobular septal thickening probably represents superimposed interstitial pulmonary edema. Moderate right and small left pleural effusions. Upper Abdomen: No acute abnormality. Musculoskeletal: Paraspinal muscle calcifications, likely disuse atrophy. Right mastectomy. No acute osseous abnormality identified. Review of the MIP images confirms the above findings. IMPRESSION: 1. No pulmonary embolus identified. 2. Multifocal pneumonia with dense right upper lobe consolidation. 3. Interstitial pulmonary edema. 4. Moderate right and small left pleural effusions. 5. Dense aortic and mitral valvular calcifications can be associated with valvular dysfunction. 6. Moderate cardiomegaly. Mild aortic and coronary artery calcific atherosclerosis. 7. Multinodular goiter. Electronically Signed   By: Kristine Garbe M.D.   On: 07/30/2017 21:30     ASSESSMENT AND PLAN:    83 year old female with history of breast cancer and essential hypertension who presents with shortness of breath and cough.  1. Sepsis: Patient presents with leukocytosis and tachypnea. Sepsis due to pneumonia.  2.  Multifocal pneumonia with dense upper lobe consolidation: Continue cefepime and vancomycin  If MRSA PCR is negative discontinue vancomycin Speech consultation to evaluate for underlying aspiration  3. Hypothyroid: Continue Synthroid  4. Essential hypertension: Continue losartan and metoprolol   5. Elevated troponin: This is due to demand ischemia from pneumonia and not ACS.   PT eval in am Management plans discussed with the patient and son and they are in agreement.  CODE STATUS: DNR  TOTAL TIME TAKING CARE OF THIS PATIENT: 30 minutes.     POSSIBLE D/C 2-4 days, DEPENDING ON CLINICAL CONDITION.   Maaz Spiering M.D on 07/31/2017 at 10:42 AM  Between 7am to 6pm - Pager - [phone] After 6pm go to [email or web address] - password [email or web address] [facility]  Office  [phone]  CC: Primary care physician; Baxter Hire, MD  Note: This dictation was prepared with Dragon dictation along with smaller phrase technology. Any transcriptional errors that result from this process are unintentional.

## 2017-07-31 NOTE — ED Notes (Signed)
Pt transported to room 119 

## 2017-08-01 ENCOUNTER — Inpatient Hospital Stay: Payer: Medicare Other

## 2017-08-01 LAB — CBC
HEMATOCRIT: 33.9 % — AB (ref 35.0–47.0)
HEMOGLOBIN: 11.7 g/dL — AB (ref 12.0–16.0)
MCH: 31.4 pg (ref 26.0–34.0)
MCHC: 34.5 g/dL (ref 32.0–36.0)
MCV: 91 fL (ref 80.0–100.0)
Platelets: 176 10*3/uL (ref 150–440)
RBC: 3.72 MIL/uL — AB (ref 3.80–5.20)
RDW: 14.5 % (ref 11.5–14.5)
WBC: 10.8 10*3/uL (ref 3.6–11.0)

## 2017-08-01 LAB — BLOOD GAS, ARTERIAL
Acid-Base Excess: 4.6 mmol/L — ABNORMAL HIGH (ref 0.0–2.0)
Bicarbonate: 30.4 mmol/L — ABNORMAL HIGH (ref 20.0–28.0)
FIO2: 0.32
O2 Saturation: 92 %
PCO2 ART: 48 mmHg (ref 32.0–48.0)
PH ART: 7.41 (ref 7.350–7.450)
PO2 ART: 63 mmHg — AB (ref 83.0–108.0)
Patient temperature: 37

## 2017-08-01 LAB — BASIC METABOLIC PANEL
ANION GAP: 8 (ref 5–15)
BUN: 22 mg/dL — ABNORMAL HIGH (ref 6–20)
CHLORIDE: 98 mmol/L — AB (ref 101–111)
CO2: 27 mmol/L (ref 22–32)
Calcium: 8 mg/dL — ABNORMAL LOW (ref 8.9–10.3)
Creatinine, Ser: 0.96 mg/dL (ref 0.44–1.00)
GFR calc non Af Amer: 48 mL/min — ABNORMAL LOW (ref >60)
GFR, EST AFRICAN AMERICAN: 56 mL/min — AB (ref >60)
Glucose, Bld: 147 mg/dL — ABNORMAL HIGH (ref 65–99)
POTASSIUM: 4.7 mmol/L (ref 3.5–5.1)
Sodium: 133 mmol/L — ABNORMAL LOW (ref 135–145)

## 2017-08-01 MED ORDER — PANTOPRAZOLE SODIUM 40 MG PO TBEC
40.0000 mg | DELAYED_RELEASE_TABLET | Freq: Every day | ORAL | Status: DC
  Administered 2017-08-01 – 2017-08-03 (×3): 40 mg via ORAL
  Filled 2017-08-01 (×3): qty 1

## 2017-08-01 NOTE — Evaluation (Signed)
Physical Therapy Evaluation Patient Details Name: Melissa Savage MRN: [id number] DOB: Nov 20, 1917 Today's Date: 08/01/2017   History of Present Illness  Pt admitted for HCAP as well as pneumonia. Pt complaints of SOB with cough x 1 week. History includes breast cancer and HTN.   Clinical Impression  Pt is a pleasant 83 year old female who was admitted for HCAP. Pt performs bed mobility with min assist and transfers/ambulation with cga and HHA. Pt demonstrates deficits with balance/endurance/mobility. Unable to further progress ambulation secondary to pt needing to go to test. All mobility performed on 3L of O2. Would benefit from skilled PT to address above deficits and promote optimal return to PLOF. Recommend transition to [facility] upon discharge from acute hospitalization. May need increased supervision at home temporarily secondary to safety, discussed with son.       Follow Up Recommendations Home health PT;Supervision for mobility/OOB    Equipment Recommendations  Rolling walker with 5" wheels    Recommendations for Other Services       Precautions / Restrictions Precautions Precautions: Fall Restrictions Weight Bearing Restrictions: No      Mobility  Bed Mobility Overal bed mobility: Needs Assistance Bed Mobility: Supine to Sit     Supine to sit: Min assist     General bed mobility comments: needs assist for scooting hips out towards EOB as well as trunkal elevation. Once seated at EOB, able to sit with upright posture  Transfers Overall transfer level: Needs assistance Equipment used: None Transfers: Sit to/from Stand Sit to Stand: Min guard         General transfer comment: HHA given for standing. Unsteadiness noted as pt reaching for furniture. Once standing, able to stand with fair static stance.  Ambulation/Gait Ambulation/Gait assistance: Min guard Ambulation Distance (Feet): 3 Feet Assistive device: 1 person hand held assist Gait  Pattern/deviations: Step-to pattern     General Gait Details: slow and cautious step to gait pattern from bed -> recliner. Pt unsteady. Ambulation distance limited as orderly came to transport to testing. Able to take a few steps back to bed with cga.  3L of O2 maintained with O2 sats at 93%.  Stairs            Wheelchair Mobility    Modified Rankin (Stroke Patients Only)       Balance Overall balance assessment: Needs assistance Sitting-balance support: Feet supported Sitting balance-Leahy Scale: Good     Standing balance support: No upper extremity supported Standing balance-Leahy Scale: Fair                               Pertinent Vitals/Pain Pain Assessment: No/denies pain    Home Living Family/patient expects to be discharged to:: Private residence Living Arrangements: Alone Available Help at Discharge: Family;Available PRN/intermittently(son checks on her frequently thoughout the day) Type of Home: House Home Access: Stairs to enter Entrance Stairs-Rails: Can reach both Entrance Stairs-Number of Steps: 3 Home Layout: One level Home Equipment: Cane - single point      Prior Function Level of Independence: Independent with assistive device(s)         Comments: has been very independent; recently using SPC. Reports no falls     Hand Dominance        Extremity/Trunk Assessment   Upper Extremity Assessment Upper Extremity Assessment: Generalized weakness(B UE grossly 4/5)    Lower Extremity Assessment Lower Extremity Assessment: Generalized weakness(B LE grossly 4/5)  Communication   Communication: No difficulties  Cognition Arousal/Alertness: Awake/alert Behavior During Therapy: WFL for tasks assessed/performed Overall Cognitive Status: Within Functional Limits for tasks assessed                                        General Comments      Exercises     Assessment/Plan    PT Assessment Patient  needs continued PT services  PT Problem List Decreased strength;Decreased activity tolerance;Decreased balance;Decreased mobility       PT Treatment Interventions Gait training;DME instruction;Therapeutic exercise;Balance training    PT Goals (Current goals can be found in the Care Plan section)  Acute Rehab PT Goals Patient Stated Goal: to get stronger PT Goal Formulation: With patient Time For Goal Achievement: 08/15/17 Potential to Achieve Goals: Good    Frequency Min 2X/week   Barriers to discharge        Co-evaluation               AM-PAC PT "6 Clicks" Daily Activity  Outcome Measure Difficulty turning over in bed (including adjusting bedclothes, sheets and blankets)?: Unable Difficulty moving from lying on back to sitting on the side of the bed? : Unable Difficulty sitting down on and standing up from a chair with arms (e.g., wheelchair, bedside commode, etc,.)?: Unable Help needed moving to and from a bed to chair (including a wheelchair)?: A Little Help needed walking in hospital room?: A Little Help needed climbing 3-5 steps with a railing? : A Lot 6 Click Score: 11    End of Session Equipment Utilized During Treatment: Gait belt Activity Tolerance: Patient tolerated treatment well Patient left: in bed;with bed alarm set;with nursing/sitter in room Nurse Communication: Mobility status PT Visit Diagnosis: Unsteadiness on feet (R26.81);Muscle weakness (generalized) (M62.81);Difficulty in walking, not elsewhere classified (R26.2)    Time: 9675-9163 PT Time Calculation (min) (ACUTE ONLY): 23 min   Charges:   PT Evaluation $PT Eval Low Complexity: 1 Low     PT G Codes:   PT G-Codes **NOT FOR INPATIENT CLASS** Functional Assessment Tool Used: AM-PAC 6 Clicks Basic Mobility Functional Limitation: Mobility: Walking and moving around Mobility: Walking and Moving Around Current Status (W4665): At least 60 percent but less than 80 percent impaired, limited or  restricted Mobility: Walking and Moving Around Goal Status [phone]): At least 40 percent but less than 60 percent impaired, limited or restricted    Greggory Stallion, PT, DPT [phone]   Melissa Savage 08/01/2017, 2:08 PM

## 2017-08-01 NOTE — Progress Notes (Signed)
ANTIBIOTIC CONSULT NOTE - INITIAL  Pharmacy Consult for vancomycin/cefepime Indication: HCAP  No Known Allergies  Patient Measurements: Height: 4\' 11"  (149.9 cm) Weight: 119 lb 3.2 oz (54.1 kg) IBW/kg (Calculated) : 43.2 Adjusted Body Weight:   Vital Signs: Temp: 98.2 F (36.8 C) (11/28 0815) Temp Source: Oral (11/28 0815) BP: 154/52 (11/28 0815) Pulse Rate: 67 (11/28 0815) Intake/Output from previous day: 11/27 0701 - 11/28 0700 In: 653 [P.O.:600; I.V.:3; IV Piggyback:50] Out: -  Intake/Output from this shift: No intake/output data recorded.  Labs: Recent Labs    07/30/17 1805 07/30/17 1900 07/31/17 0404 08/01/17 0459  WBC 16.7*  --  15.1* 10.8  HGB 11.8*  --  12.5 11.7*  PLT 221  --  194 176  CREATININE  --  1.17* 0.96 0.96   Estimated Creatinine Clearance: 25.2 mL/min (by C-G formula based on SCr of 0.96 mg/dL). No results for input(s): VANCOTROUGH, VANCOPEAK, VANCORANDOM, GENTTROUGH, GENTPEAK, GENTRANDOM, TOBRATROUGH, TOBRAPEAK, TOBRARND, AMIKACINPEAK, AMIKACINTROU, AMIKACIN in the last 72 hours.   Microbiology: Recent Results (from the past 720 hour(s))  Blood Culture (routine x 2)     Status: None (Preliminary result)   Collection Time: 07/31/17  2:03 AM  Result Value Ref Range Status   Specimen Description BLOOD LT FOREARM  Final   Special Requests   Final    BOTTLES DRAWN AEROBIC AND ANAEROBIC Blood Culture adequate volume   Culture NO GROWTH 1 DAY  Final   Report Status PENDING  Incomplete  Blood Culture (routine x 2)     Status: None (Preliminary result)   Collection Time: 07/31/17  2:03 AM  Result Value Ref Range Status   Specimen Description BLOOD LT WRIST  Final   Special Requests   Final    BOTTLES DRAWN AEROBIC AND ANAEROBIC Blood Culture adequate volume   Culture NO GROWTH 1 DAY  Final   Report Status PENDING  Incomplete  MRSA PCR Screening     Status: None   Collection Time: 07/31/17  7:19 AM  Result Value Ref Range Status   MRSA by PCR  NEGATIVE NEGATIVE Final    Comment:        The GeneXpert MRSA Assay (FDA approved for NASAL specimens only), is one component of a comprehensive MRSA colonization surveillance program. It is not intended to diagnose MRSA infection nor to guide or monitor treatment for MRSA infections.     Medical History: Past Medical History:  Diagnosis Date  . Breast cancer ([place])   . Hypertension     Medications:  Infusions:  . sodium chloride    . ceFEPime (MAXIPIME) IV Stopped (07/31/17 1751)   Assessment: 46 yof presents to ED with SOB, treated for PNA 2 weeks ago. Decreased O2, 93% on RA, then dropped to 85% O2 started. Pharmacy consulted to dose cefepime and vancomycin for HCAP.   MRSA PCR (-) 11/27  Goal of Therapy:  Vancomycin 15 to 20 mcg/mL Resolve infection Prevent ADE  Plan:  1. Continue Cefepime 1 gm IV Q24H based on current renal function 2. MRSA PCR (-), vancomycin discontinued on 11/27.   Makailey Hodgkin M Lasundra Hascall, Pharm.D., BCPS Clinical Pharmacist 08/01/2017,9:23 AM

## 2017-08-01 NOTE — Evaluation (Signed)
Clinical/Bedside Swallow Evaluation Patient Details  Name: Melissa Savage MRN: [id number] Date of Birth: Sep 06, 1917  Today's Date: 08/01/2017 Time: SLP Start Time (ACUTE ONLY): 0830 SLP Stop Time (ACUTE ONLY): 0930 SLP Time Calculation (min) (ACUTE ONLY): 60 min  Past Medical History:  Past Medical History:  Diagnosis Date  . Breast cancer ([place])   . Hypertension    Past Surgical History:  Past Surgical History:  Procedure Laterality Date  . BREAST LUMPECTOMY    . MASTECTOMY Right 1980   HPI:  Pt is a 83 y.o. female with a known history of breast cancer, hypertension was brought to the emergency room for shortness of breath for the last 1 week and cough. Patient was treated for pneumonia by primary care physician but the symptoms got worse. Patient felt more short of breath for the last 1 week and she was hypoxic when she came to the emergency room she was put on Lanoxin by nasal cannula. No recent travel. Patient lives alone and independent in activities of daily living. Patient does not want any cardiac resuscitation, intubation and ventilatory the need arises. Patient is DO NOT RESUSCITATE by CODE STATUS. Has generalized weakness. She was worked up with CT chest which showed no pulmonary embolism, multifocal pneumonia noted along with pleural effusions. Hospitalist service was consulted. No complaints of any chest pain, orthopnea.   Assessment / Plan / Recommendation Clinical Impression  Pt appeared to present w/ moderate oropharyngeal Dysphagia w/ a moderate risk for aspiration. Pt was tx for pneumonia approx 2 weeks ago - came to ED c/o SOB on 07/31/17. Pt was on [place] 3[place]. Pt's daughter in law was present during evaluation. Respiratory issues can increase aspiration/choking risk d/t timing of swallow/breathing.  Pt was given trials of thin liquids via cup, Nectar consistency liquid via cup, puree, and soft solids. During trials of thin liquid and Nectar via cup, pt's oral phase  appeared grossly [facility] c/b timely A-P transit and swallow, and adequate oral clearing. Pt exhibited mild-moderate coughing frequently when given trials of thin liquids and Nectar consistency liquids. Pt exhibited SOB/WOB and increased RR during tasks of drinking. No decline in vocal quality was noted. Pt was educated on pacing herself, slowing down during meals, and reducing environmental distractions such as talking while eating/drinking. During trials of puree, pt's oral phase was grossly [facility] c/b bolus management and control, timely A-P transit and swallow, w/ adequate oral clearing. Pt exhibited mild intermittent coughing and increased SOB/WOB given trials of puree. ST reinforced a slow pace while eating/drinking. No decline in vocal quality was noted. During trials of soft solids, pt exhibited slight oral residue, but given time and a follow up swallow pt was able to clear mouth. Pt exhibited adequate mastication. Pt exhibited coughing and increased WOB/SOB given trials of soft solids. Throughout all trials given, pt exhibited intermittent belching and reported she "has indigestion often" and regurgitates "once in a while". Pt was educated on aspiration precautions - sitting upright during meals, slow pace, small bites/sips, reducing environental distractions, and no straws.  D/t pt's current presentation and respiratory status, Recommend Dysphagia level 2 diet w/ Nectar consistency liquids d/t conservation of energy/lessen WOB/SOB. Recommend aspiration precautions - sitting upright during meals, slow pace, small bites/sips, reducing environental distractions, and no straws. Recommend meds crushed in puree. Recommend min assist/set up for meals. Recommend an objective study (MBSS) to further assess the pt. Recommend f/u consult w/ GI to address pt's reports of indigestion and regurgitation.  ST services will  f/u w/ formal assessment, diet toleration, pt status, and education while admitted. MD/NSG updated.    SLP Visit Diagnosis: Dysphagia, oropharyngeal phase (R13.12);Dysphagia, pharyngeal phase (R13.13)    Aspiration Risk  Moderate aspiration risk    Diet Recommendation   Dysphagia level 2 w/ Nectar consistency liquids; aspiration precautions.   Medication Administration: Crushed with puree    Other  Recommendations Recommended Consults: Consider GI evaluation Oral Care Recommendations: Oral care BID;Patient independent with oral care Other Recommendations: Remove water pitcher   Follow up Recommendations (TBD)      Frequency and Duration min 3x week  2 weeks       Prognosis Prognosis for Safe Diet Advancement: Fair Barriers to Reach Goals: Severity of deficits      Swallow Study   General Date of Onset: 07/30/17 HPI: Pt is a 83 y.o. female with a known history of breast cancer, hypertension was brought to the emergency room for shortness of breath for the last 1 week and cough. Patient was treated for pneumonia by primary care physician but the symptoms got worse. Patient felt more short of breath for the last 1 week and she was hypoxic when she came to the emergency room she was put on Lanoxin by nasal cannula. No recent travel. Patient lives alone and independent in activities of daily living. Patient does not want any cardiac resuscitation, intubation and ventilatory the need arises. Patient is DO NOT RESUSCITATE by CODE STATUS. Has generalized weakness. She was worked up with CT chest which showed no pulmonary embolism, multifocal pneumonia noted along with pleural effusions. Hospitalist service was consulted. No complaints of any chest pain, orthopnea. Type of Study: Bedside Swallow Evaluation Previous Swallow Assessment: none reported Diet Prior to this Study: Dysphagia 3 (soft);Thin liquids Temperature Spikes Noted: No(wbc WNL) Respiratory Status: Nasal cannula(3 L) History of Recent Intubation: No Behavior/Cognition: Alert;Cooperative;Pleasant mood;Distractible;Requires  cueing Oral Cavity Assessment: Within Functional Limits Oral Care Completed by SLP: Recent completion by staff Oral Cavity - Dentition: Adequate natural dentition Vision: Functional for self-feeding Self-Feeding Abilities: Able to feed self;Needs set up;Needs assist Patient Positioning: Upright in bed Baseline Vocal Quality: Normal Volitional Cough: Congested Volitional Swallow: Able to elicit    Oral/Motor/Sensory Function Overall Oral Motor/Sensory Function: Within functional limits   Ice Chips Ice chips: Not tested   Thin Liquid Thin Liquid: Impaired Presentation: Cup;Self Fed(10 trials) Pharyngeal  Phase Impairments: Cough - Immediate(7x mild-mod)    Nectar Thick Nectar Thick Liquid: Impaired Presentation: Cup;Self Fed(5 trials) Pharyngeal Phase Impairments: Cough - Immediate(3x mild-mod)   Honey Thick Honey Thick Liquid: Not tested   Puree Puree: Within functional limits Presentation: Spoon;Self Fed(6 oz)   Solid   GO   Solid: Not tested       Carolynn Sayers, SLP-Graduate Student Carolynn Sayers 08/01/2017,10:36 AM   The information in this patient note, response to treatment, and overall treatment plan developed has been reviewed and agreed upon by this clinician.  Orinda Kenner, [place], [place] [phone] 08/02/17,4:40 PM

## 2017-08-01 NOTE — Progress Notes (Signed)
[place] at [place] NAME: Melissa Savage    MR#:  [id number]  DATE OF BIRTH:  1917/11/07  SUBJECTIVE:  Speech has seen patient she may be aspirating and/or regurgitating. Her breathing is stable    REVIEW OF SYSTEMS:    Review of Systems  Constitutional: Negative for fever, chills weight loss HENT: Negative for ear pain, nosebleeds, congestion, facial swelling, rhinorrhea, neck pain, neck stiffness and ear discharge.   Respiratory: Positive for cough and shortness of breath no wheezing Cardiovascular: Negative for chest pain, palpitations and leg swelling.  Gastrointestinal: Negative for heartburn, abdominal pain, vomiting, diarrhea or consitpation Genitourinary: Negative for dysuria, urgency, frequency, hematuria Musculoskeletal: Negative for back pain or joint pain Neurological: Negative for dizziness, seizures, syncope, focal weakness,  numbness and headaches.  Hematological: Does not bruise/bleed easily.  Psychiatric/Behavioral: Negative for hallucinations, confusion, dysphoric mood    Tolerating Diet: yes      DRUG ALLERGIES:  No Known Allergies  VITALS:  Blood pressure (!) 154/52, pulse 67, temperature 98.2 F (36.8 C), temperature source Oral, resp. rate 20, height 4\' 11"  (1.499 m), weight 54.1 kg (119 lb 3.2 oz), SpO2 98 %.  PHYSICAL EXAMINATION:  Constitutional: Appears well-developed and well-nourished. No distress. HENT: Normocephalic. Marland Kitchen Oropharynx is clear and moist.  Eyes: Conjunctivae and EOM are normal. PERRLA, no scleral icterus.  Neck: Normal ROM. Neck supple. No JVD. No tracheal deviation. CVS: RRR, S1/S2 +, no murmurs, no gallops, no carotid bruit.  Pulmonary: Decreased breath sounds throughout lung fields no wheezing or crackles. Abdominal: Soft. BS +,  no distension, tenderness, rebound or guarding.  Musculoskeletal: Normal range of motion. No edema and no tenderness.  Neuro: Alert. CN 2-12 grossly  intact. No focal deficits. Skin: Skin is warm and dry. No rash noted. Psychiatric: Normal mood and affect.      LABORATORY PANEL:   CBC Recent Labs  Lab 08/01/17 0459  WBC 10.8  HGB 11.7*  HCT 33.9*  PLT 176   ------------------------------------------------------------------------------------------------------------------  Chemistries  Recent Labs  Lab 08/01/17 0459  NA 133*  K 4.7  CL 98*  CO2 27  GLUCOSE 147*  BUN 22*  CREATININE 0.96  CALCIUM 8.0*   ------------------------------------------------------------------------------------------------------------------  Cardiac Enzymes Recent Labs  Lab 07/31/17 0404 07/31/17 1003 07/31/17 1549  TROPONINI 0.06* 0.06* 0.06*   ------------------------------------------------------------------------------------------------------------------  RADIOLOGY:  Dg Chest 2 View  Addendum Date: 07/31/2017   ADDENDUM REPORT: 07/31/2017 18:04 ADDENDUM: Prior radiographs are made available for comparison. Comparison is made with 07/16/2017. Slight increased bilateral right greater than left pulmonary interstitial and alveolar opacity since the most recent prior. CT chest suggested for further evaluation. Electronically Signed   By: Donavan Foil M.D.   On: 07/31/2017 18:04   Result Date: 07/31/2017 CLINICAL DATA:  Acute onset shortness of breath EXAM: CHEST  2 VIEW COMPARISON:  Report 07/16/2017, 09/08/2003; the images were unable to be retrieved at the time of dictation and an addendum will be issued when the priors are available for comparison FINDINGS: Coarse bilateral right greater than left interstitial and alveolar opacity. No pleural effusion. Mild cardiomegaly with aortic atherosclerosis. No pneumothorax. Degenerative changes of the spine. IMPRESSION: 1. Coarse bilateral right greater than left interstitial and alveolar opacity, may reflect acute inflammatory or infectious process on underlying chronic changes. 2. Mild  cardiomegaly Electronically Signed: By: Donavan Foil M.D. On: 07/30/2017 20:24   Ct Angio Chest Pe W And/or Wo Contrast  Result Date: 07/30/2017 CLINICAL DATA:  83  y/o F; treated for pneumonia 2 weeks ago. Completed antibiotics. Persistent shortness of breath. PE suspected, high pretest probability. History of breast cancer. EXAM: CT ANGIOGRAPHY CHEST WITH CONTRAST TECHNIQUE: Multidetector CT imaging of the chest was performed using the standard protocol during bolus administration of intravenous contrast. Multiplanar CT image reconstructions and MIPs were obtained to evaluate the vascular anatomy. CONTRAST:  31mL ISOVUE-370 IOPAMIDOL (ISOVUE-370) INJECTION 76% COMPARISON:  07/30/2017 chest radiograph. FINDINGS: Cardiovascular: Normal caliber thoracic aorta. Moderate calcific atherosclerosis of the aorta. Mild coronary artery calcification dense aortic valvular and mitral annular calcifications. Mild cardiomegaly.  No pericardial effusion. Satisfactory opacification of the main pulmonary artery. Normal caliber main pulmonary artery. No pulmonary embolus. Mediastinum/Nodes: Multinodular thyroid goiter with calcified nodule in left lobe of thyroid measuring up to 3.3 cm. Lungs/Pleura: Diffuse multifocal pneumonia with dense consolidation in the right upper lobe. Smooth interlobular septal thickening probably represents superimposed interstitial pulmonary edema. Moderate right and small left pleural effusions. Upper Abdomen: No acute abnormality. Musculoskeletal: Paraspinal muscle calcifications, likely disuse atrophy. Right mastectomy. No acute osseous abnormality identified. Review of the MIP images confirms the above findings. IMPRESSION: 1. No pulmonary embolus identified. 2. Multifocal pneumonia with dense right upper lobe consolidation. 3. Interstitial pulmonary edema. 4. Moderate right and small left pleural effusions. 5. Dense aortic and mitral valvular calcifications can be associated with valvular  dysfunction. 6. Moderate cardiomegaly. Mild aortic and coronary artery calcific atherosclerosis. 7. Multinodular goiter. Electronically Signed   By: Kristine Garbe M.D.   On: 07/30/2017 21:30     ASSESSMENT AND PLAN:    83 year old female with history of breast cancer and essential hypertension who presents with shortness of breath and cough.  1. Sepsis: Patient presents with leukocytosis and tachypnea. Sepsis is due to pneumonia.  2. Multifocal pneumonia with dense upper lobe consolidation: Continue cefepime for now  MSRA PCR was negative so Vancomycin was discontinued.  Patient will go for modified barium today. Continue diet as per recommendations of speech. Add PPI due to regurgitation.  3. Hypothyroid: Continue Synthroid  4. Essential hypertension: Continue losartan and metoprolol   5. Elevated troponin: This is due to demand ischemia from pneumonia and not ACS.   PT eval pending  Management plans discussed with the patient and daughter in law and they are in agreement.  CODE STATUS: DNR  TOTAL TIME TAKING CARE OF THIS PATIENT: 28 minutes.     POSSIBLE D/C 2-4 days, DEPENDING ON CLINICAL CONDITION.   Carrie Usery M.D on 08/01/2017 at 10:05 AM  Between 7am to 6pm - Pager - [phone] After 6pm go to [email or web address] - password [email or web address] [facility]  Office  [phone]  CC: Primary care physician; Baxter Hire, MD  Note: This dictation was prepared with Dragon dictation along with smaller phrase technology. Any transcriptional errors that result from this process are unintentional.

## 2017-08-01 NOTE — Evaluation (Signed)
Objective Swallowing Evaluation: Type of Study: MBS-Modified Barium Swallow Study   Patient Details  Name: Melissa Savage MRN: [id number] Date of Birth: [birth year]  Today's Date: 08/01/2017 Time: SLP Start Time (ACUTE ONLY): 46 -SLP Stop Time (ACUTE ONLY): 1230  SLP Time Calculation (min) (ACUTE ONLY): 60 min   Past Medical History:  Past Medical History:  Diagnosis Date  . Breast cancer ([place])   . Hypertension    Past Surgical History:  Past Surgical History:  Procedure Laterality Date  . BREAST LUMPECTOMY    . MASTECTOMY Right 1980   HPI: Pt is a 83 y.o. female with a known history of breast cancer, hypertension was brought to the emergency room for shortness of breath for the last 1 week and cough. Patient was treated for pneumonia by primary care physician but the symptoms got worse. Patient felt more short of breath for the last 1 week and she was hypoxic when she came to the emergency room she was put on Lanoxin by nasal cannula. No recent travel. Patient lives alone and independent in activities of daily living. Patient does not want any cardiac resuscitation, intubation and ventilatory the need arises. Patient is DO NOT RESUSCITATE by CODE STATUS. Has generalized weakness. She was worked up with CT chest which showed no pulmonary embolism, multifocal pneumonia noted along with pleural effusions. Hospitalist service was consulted. No complaints of any chest pain, orthopnea.   Subjective: pt upright in MBSS chair; verbally conversive but confusion noted which increased as study continued(pt began picking at the machine parts)    Assessment / Plan / Recommendation  CHL IP CLINICAL IMPRESSIONS 08/01/2017  Clinical Impression Pt appears to present w/ Moderate pharyngeal phase dysphagia; Mild oral phase dysphagia. Pt does present w/ an increased risk for aspiration of thin liquids. During the Oral phase, pt appeared to present w/ inconsistent decreased bolus coordination and  control w/ premature spillage moreso w/ thin liquids; bolus piecemealing noted intermittently. Pt exhibited full oral clearing post swallowing w/ all trial consistencies. Pt required min increased mastication time/effort w/ increased texture food d/t missing lower molars, effort. During the Pharyngeal phase, Pt exhibited delayed pharyngeal swallow initiation w/ thin liquid consistency resulting in laryngeal penetration and aspiration w/ the thin liquid trial. When pt followed strict aspiration precautions of Single, Small sips Slowly, pt demonstrated min delay in pharyngeal swallow inititation but no laryngeal penetration or aspiration w/ Nectar consistency liquid. Similar appropriate swallow response was noted w/ trials of puree and minced mech soft trials given in small amounts slowly.  Pt exhibited no pharyngeal residue post swallowing of all trial consistencies indicating adequate laryngeal excursion and pharyngeal pressure during the swallowing. Of note, during the Esophageal phase, a small protrusion/outpouching of tissue in the Cervical Esophagus but w/ no bolus stasis/residue or significant bolus dysmotility in this area w/ trial consistencies noted.   SLP Visit Diagnosis Dysphagia, oropharyngeal phase (R13.12);Dysphagia, pharyngeal phase (R13.13)  Attention and concentration deficit following --  Frontal lobe and executive function deficit following --  Impact on safety and function Moderate aspiration risk      CHL IP TREATMENT RECOMMENDATION 08/01/2017  Treatment Recommendations Therapy as outlined in treatment plan below     Prognosis 08/01/2017  Prognosis for Safe Diet Advancement Fair  Barriers to Reach Goals Severity of deficits;Cognitive deficits  Barriers/Prognosis Comment --    CHL IP DIET RECOMMENDATION 08/01/2017  SLP Diet Recommendations Dysphagia 2 (Fine chop) solids;Nectar thick liquid  Liquid Administration via Cup;No straw  Medication Administration Crushed  with puree   Compensations Minimize environmental distractions;Slow rate;Small sips/bites;Lingual sweep for clearance of pocketing;Multiple dry swallows after each bite/sip;Follow solids with liquid  Postural Changes Remain semi-upright after after feeds/meals (Comment);Seated upright at 90 degrees      CHL IP OTHER RECOMMENDATIONS 08/01/2017  Recommended Consults Consider ENT evaluation  Oral Care Recommendations Oral care BID;Staff/trained caregiver to provide oral care  Other Recommendations Order thickener from pharmacy;Prohibited food (jello, ice cream, thin soups);Remove water pitcher;Have oral suction available      CHL IP FOLLOW UP RECOMMENDATIONS 08/01/2017  Follow up Recommendations Skilled Nursing facility      [facility] IP FREQUENCY AND DURATION 08/01/2017  Speech Therapy Frequency (ACUTE ONLY) min 3x week  Treatment Duration 2 weeks           CHL IP ORAL PHASE 08/01/2017  Oral Phase Impaired  Oral - Pudding Teaspoon --  Oral - Pudding Cup --  Oral - Honey Teaspoon --  Oral - Honey Cup --  Oral - Nectar Teaspoon --  Oral - Nectar Cup --  Oral - Nectar Straw --  Oral - Thin Teaspoon --  Oral - Thin Cup --  Oral - Thin Straw --  Oral - Puree --  Oral - Mech Soft --  Oral - Regular --  Oral - Multi-Consistency --  Oral - Pill --  Oral Phase - Comment Pt appeared to present w/ inconsistent decreased bolus coordination and control w/ premature spillage moreso w/ thin liquids; bolus piecemealing noted intermittently. Pt exhibited full oral clearing post swallowing w/ all trial consistencies. Pt required min increased mastication time/effort w/ increased texture food d/t missing lower molars, effort.     CHL IP PHARYNGEAL PHASE 08/01/2017  Pharyngeal Phase Impaired  Pharyngeal- Pudding Teaspoon --  Pharyngeal --  Pharyngeal- Pudding Cup --  Pharyngeal --  Pharyngeal- Honey Teaspoon --  Pharyngeal --  Pharyngeal- Honey Cup --  Pharyngeal --  Pharyngeal- Nectar Teaspoon --   Pharyngeal --  Pharyngeal- Nectar Cup --  Pharyngeal --  Pharyngeal- Nectar Straw --  Pharyngeal --  Pharyngeal- Thin Teaspoon --  Pharyngeal --  Pharyngeal- Thin Cup --  Pharyngeal --  Pharyngeal- Thin Straw --  Pharyngeal --  Pharyngeal- Puree --  Pharyngeal --  Pharyngeal- Mechanical Soft --  Pharyngeal --  Pharyngeal- Regular --  Pharyngeal --  Pharyngeal- Multi-consistency --  Pharyngeal --  Pharyngeal- Pill --  Pharyngeal --  Pharyngeal Comment Pt exhibited delayed pharyngeal swallow initiation w/ thin liquid consistency resulting in laryngeal penetration and aspiration w/ the thin liquid trial. When pt followed strict aspiration precautions of Single, Small sips Slowly, pt demonstrated min delay in pharyngeal swallow inititation but no laryngeal penetration or aspiration w/Nectar consistency liquid. Similar appropriate swallow response was noted w/ trials of puree and minced mech soft trials given in small amounts slowly.  Pt exhibited no pharyngeal residue post swallowing of all trial consistencies indicating adequate laryngeal excursion and pharyngeal pressure during the swallowing.      CHL IP CERVICAL ESOPHAGEAL PHASE 08/01/2017  Cervical Esophageal Phase Impaired  Pudding Teaspoon --  Pudding Cup --  Honey Teaspoon --  Honey Cup --  Nectar Teaspoon --  Nectar Cup --  Nectar Straw --  Thin Teaspoon --  Thin Cup --  Thin Straw --  Puree --  Mechanical Soft --  Regular --  Multi-consistency --  Pill --  Cervical Esophageal Comment Noted a small protrusion/outpouching of tissue in the Cervical Esophagus but w/ no bolus stasis/residue or significant  bolus dysmotility in this area w/ trial consistencies noted.    CHL IP GO 08/01/2017  Functional Assessment Tool Used clinical judgement  Functional Limitations Swallowing  Swallow Current Status [phone]) CK  Swallow Goal Status (V9563) CK  Swallow Discharge Status (O7564) CK  Motor Speech Current Status (P3295)  (None)  Motor Speech Goal Status (J8841) (None)  Motor Speech Goal Status (Y6063) (None)  Spoken Language Comprehension Current Status (K1601) (None)  Spoken Language Comprehension Goal Status (U9323) (None)  Spoken Language Comprehension Discharge Status [phone]) (None)  Spoken Language Expression Current Status [phone]) (None)  Spoken Language Expression Goal Status (Y7062) (None)  Spoken Language Expression Discharge Status [phone]) (None)  Attention Current Status (B1517) (None)  Attention Goal Status (O1607) (None)  Attention Discharge Status [phone]) (None)  Memory Current Status (Y6948) (None)  Memory Goal Status (N4627) (None)  Memory Discharge Status (O3500) (None)  Voice Current Status (X3818) (None)  Voice Goal Status (E9937) (None)  Voice Discharge Status (J6967) (None)  Other Speech-Language Pathology Functional Limitation Current Status (E9381) (None)  Other Speech-Language Pathology Functional Limitation Goal Status (O1751) (None)  Other Speech-Language Pathology Functional Limitation Discharge Status [phone]) (None)      Orinda Kenner, MS, CCC-SLP Watson,Katherine 08/01/2017, 5:09 PM

## 2017-08-02 DIAGNOSIS — J189 Pneumonia, unspecified organism: Secondary | ICD-10-CM

## 2017-08-02 DIAGNOSIS — Z515 Encounter for palliative care: Secondary | ICD-10-CM

## 2017-08-02 DIAGNOSIS — J9601 Acute respiratory failure with hypoxia: Secondary | ICD-10-CM

## 2017-08-02 DIAGNOSIS — Z7189 Other specified counseling: Secondary | ICD-10-CM

## 2017-08-02 LAB — BASIC METABOLIC PANEL
Anion gap: 9 (ref 5–15)
BUN: 31 mg/dL — AB (ref 6–20)
CALCIUM: 8.5 mg/dL — AB (ref 8.9–10.3)
CO2: 28 mmol/L (ref 22–32)
Chloride: 98 mmol/L — ABNORMAL LOW (ref 101–111)
Creatinine, Ser: 1 mg/dL (ref 0.44–1.00)
GFR calc Af Amer: 53 mL/min — ABNORMAL LOW (ref >60)
GFR, EST NON AFRICAN AMERICAN: 46 mL/min — AB (ref >60)
GLUCOSE: 165 mg/dL — AB (ref 65–99)
Potassium: 4.5 mmol/L (ref 3.5–5.1)
Sodium: 135 mmol/L (ref 135–145)

## 2017-08-02 MED ORDER — HALOPERIDOL 0.5 MG PO TABS
0.5000 mg | ORAL_TABLET | ORAL | Status: DC | PRN
  Filled 2017-08-02: qty 1

## 2017-08-02 MED ORDER — SODIUM CHLORIDE 0.9 % IV SOLN
INTRAVENOUS | Status: DC
  Administered 2017-08-02: 09:00:00 via INTRAVENOUS

## 2017-08-02 MED ORDER — SODIUM CHLORIDE 0.9% FLUSH: 3.0000 mL | INTRAVENOUS | Status: DC | PRN

## 2017-08-02 MED ORDER — POLYVINYL ALCOHOL 1.4 % OP SOLN
1.0000 [drp] | Freq: Four times a day (QID) | OPHTHALMIC | Status: DC | PRN
  Filled 2017-08-02: qty 15

## 2017-08-02 MED ORDER — GLYCOPYRROLATE 0.2 MG/ML IJ SOLN: 0.2000 mg | INTRAMUSCULAR | Status: DC | PRN

## 2017-08-02 MED ORDER — GLYCOPYRROLATE 0.2 MG/ML IJ SOLN
0.2000 mg | INTRAMUSCULAR | Status: DC | PRN
  Administered 2017-08-03 (×2): 0.2 mg via INTRAVENOUS
  Filled 2017-08-02 (×3): qty 1

## 2017-08-02 MED ORDER — HALOPERIDOL LACTATE 5 MG/ML IJ SOLN
0.5000 mg | INTRAMUSCULAR | Status: DC | PRN
  Administered 2017-08-03: 01:00:00 0.5 mg via INTRAVENOUS
  Filled 2017-08-02: qty 1

## 2017-08-02 MED ORDER — MORPHINE SULFATE (PF) 2 MG/ML IV SOLN: 1.0000 mg | INTRAVENOUS | Status: DC | PRN

## 2017-08-02 MED ORDER — ONDANSETRON HCL 4 MG/2ML IJ SOLN: 4.0000 mg | Freq: Four times a day (QID) | INTRAMUSCULAR | Status: DC | PRN

## 2017-08-02 MED ORDER — HALOPERIDOL LACTATE 2 MG/ML PO CONC
0.5000 mg | ORAL | Status: DC | PRN
  Filled 2017-08-02: qty 0.3

## 2017-08-02 MED ORDER — ACETAMINOPHEN 650 MG RE SUPP: 650.0000 mg | Freq: Four times a day (QID) | RECTAL | Status: DC | PRN

## 2017-08-02 MED ORDER — FUROSEMIDE 10 MG/ML IJ SOLN
40.0000 mg | Freq: Once | INTRAMUSCULAR | Status: AC
  Administered 2017-08-02: 40 mg via INTRAVENOUS
  Filled 2017-08-02: qty 4

## 2017-08-02 MED ORDER — GLYCOPYRROLATE 1 MG PO TABS
1.0000 mg | ORAL_TABLET | ORAL | Status: DC | PRN
  Filled 2017-08-02: qty 1

## 2017-08-02 MED ORDER — LORAZEPAM 2 MG/ML PO CONC
1.0000 mg | ORAL | Status: DC | PRN
  Administered 2017-08-03: 1 mg via SUBLINGUAL
  Filled 2017-08-02: qty 1

## 2017-08-02 MED ORDER — HALOPERIDOL LACTATE 5 MG/ML IJ SOLN
2.0000 mg | Freq: Four times a day (QID) | INTRAMUSCULAR | Status: DC | PRN
  Administered 2017-08-02: 05:00:00 2 mg via INTRAVENOUS
  Filled 2017-08-02: qty 1

## 2017-08-02 MED ORDER — ACETAMINOPHEN 325 MG PO TABS: 650.0000 mg | ORAL_TABLET | Freq: Four times a day (QID) | ORAL | Status: DC | PRN

## 2017-08-02 MED ORDER — BIOTENE DRY MOUTH MT LIQD: 15.0000 mL | OROMUCOSAL | Status: DC | PRN

## 2017-08-02 MED ORDER — SODIUM CHLORIDE 0.9% FLUSH
3.0000 mL | Freq: Two times a day (BID) | INTRAVENOUS | Status: DC
  Administered 2017-08-02: 22:00:00 3 mL via INTRAVENOUS

## 2017-08-02 MED ORDER — LORAZEPAM 2 MG/ML IJ SOLN
1.0000 mg | INTRAMUSCULAR | Status: DC | PRN
  Administered 2017-08-02: 1 mg via INTRAVENOUS
  Filled 2017-08-02: qty 1

## 2017-08-02 MED ORDER — FUROSEMIDE 40 MG PO TABS
80.0000 mg | ORAL_TABLET | Freq: Every day | ORAL | Status: DC
  Administered 2017-08-02 – 2017-08-03 (×2): 80 mg via ORAL
  Filled 2017-08-02 (×2): qty 2

## 2017-08-02 MED ORDER — ONDANSETRON 4 MG PO TBDP
4.0000 mg | ORAL_TABLET | Freq: Four times a day (QID) | ORAL | Status: DC | PRN
  Filled 2017-08-02: qty 1

## 2017-08-02 MED ORDER — LORAZEPAM 1 MG PO TABS
1.0000 mg | ORAL_TABLET | ORAL | Status: DC | PRN
  Administered 2017-08-03: 1 mg via ORAL
  Filled 2017-08-02: qty 1

## 2017-08-02 MED ORDER — MORPHINE SULFATE (PF) 2 MG/ML IV SOLN
1.0000 mg | Freq: Once | INTRAVENOUS | Status: AC
  Administered 2017-08-02: 1 mg via INTRAVENOUS
  Filled 2017-08-02: qty 1

## 2017-08-02 MED ORDER — PIPERACILLIN-TAZOBACTAM 3.375 G IVPB
3.3750 g | Freq: Three times a day (TID) | INTRAVENOUS | Status: DC
  Administered 2017-08-02: 14:00:00 3.375 g via INTRAVENOUS
  Filled 2017-08-02: qty 50

## 2017-08-02 MED ORDER — SODIUM CHLORIDE 0.9 % IV SOLN: 250.0000 mL | INTRAVENOUS | Status: DC | PRN

## 2017-08-02 MED ORDER — MORPHINE SULFATE (PF) 2 MG/ML IV SOLN
1.0000 mg | INTRAVENOUS | Status: DC | PRN
  Administered 2017-08-02 – 2017-08-03 (×4): 1 mg via INTRAVENOUS
  Filled 2017-08-02 (×4): qty 1

## 2017-08-02 NOTE — Clinical Social Work Placement (Signed)
   CLINICAL SOCIAL WORK PLACEMENT  NOTE  Date:  08/02/2017  Patient Details  Name: Melissa Savage MRN: [id number] Date of Birth: 1917-11-02  Clinical Social Work is seeking post-discharge placement for this patient at the [place] level of care (*CSW will initial, date and re-position this form in  chart as items are completed):  Yes   Patient/family provided with [facility] Work Department's list of facilities offering this level of care within the geographic area requested by the patient (or if unable, by the patient's family).  Yes   Patient/family informed of their freedom to choose among providers that offer the needed level of care, that participate in Medicare, Medicaid or managed care program needed by the patient, have an available bed and are willing to accept the patient.  Yes   Patient/family informed of [facility]'s ownership interest in [facility] and [facility], as well as of the fact that they are under no obligation to receive care at these facilities.  PASRR submitted to EDS on 08/02/17     PASRR number received on 08/02/17     Existing PASRR number confirmed on       FL2 transmitted to all facilities in geographic area requested by pt/family on 08/02/17     FL2 transmitted to all facilities within larger geographic area on       Patient informed that his/her managed care company has contracts with or will negotiate with certain facilities, including the following:            Patient/family informed of bed offers received.  Patient chooses bed at       Physician recommends and patient chooses bed at      Patient to be transferred to   on  .  Patient to be transferred to facility by       Patient family notified on   of transfer.  Name of family member notified:        PHYSICIAN       Additional Comment:    _______________________________________________ Hendricks Schwandt, Veronia Beets, LCSW 08/02/2017, 1:50  PM

## 2017-08-02 NOTE — Clinical Social Work Note (Signed)
Clinical Social Work Assessment  Patient Details  Name: Melissa Savage MRN: [id number] Date of Birth: [birth year]  Date of referral:  08/02/17               Reason for consult:  Facility Placement                Permission sought to share information with:  Chartered certified accountant granted to share information::  Yes, Verbal Permission Granted  Name::      [place]::   [place]   Relationship::     Contact Information:     Housing/Transportation Living arrangements for the past 2 months:  [place] of Information:  Adult Children Patient Interpreter Needed:  None Criminal Activity/Legal Involvement Pertinent to Current Situation/Hospitalization:  No - Comment as needed Significant Relationships:  Adult Children Lives with:  Self Do you feel safe going back to the place where you live?  Yes Need for family participation in patient care:  Yes (Comment)  Care giving concerns:  Patient lives alone in [place].    Social Worker assessment / plan:  Holiday representative (CSW) reviewed chart and noted that PT is recommending SNF. Per chart patient is not alert and oriented. CSW contacted patient's son Melissa Savage to complete assessment. Per son patient lives alone in [place] and he checks on her 2-3 times per day depending on how she is feeling. Per Son he also lives in [place]. CSW explained that PT is recommending SNF and that medicare requires a 3 night qualifying inpatient stay in a hospital in order to pay for SNF. Patient was admitted to inpatient on 07/31/17. Son verbalized his understanding and reported that patient has never been to rehab before. Son is agreeable to SNF search in [place] and prefers [facility]. FL2 complete and faxed out. CSW will continue to follow and assist as needed.   Employment status:  Retired Forensic scientist:  [employer] PT Recommendations:  [place] / Referral to community resources:  [place]  Patient/Family's Response to care:  Patient's son is agreeable to [facility] in [place].   Patient/Family's Understanding of and Emotional Response to Diagnosis, Current Treatment, and Prognosis:  Patient's son was very pleasant and thanked CSW for assistance.   Emotional Assessment Appearance:  Appears stated age Attitude/Demeanor/Rapport:    Affect (typically observed):  Pleasant Orientation:  Oriented to Self, Oriented to Situation, Fluctuating Orientation (Suspected and/or reported Sundowners) Alcohol / Substance use:  Not Applicable Psych involvement (Current and /or in the community):  No (Comment)  Discharge Needs  Concerns to be addressed:  Discharge Planning Concerns Readmission within the last 30 days:  No Current discharge risk:  Dependent with Mobility Barriers to Discharge:  Continued Medical Work up   [employer], Veronia Beets, LCSW 08/02/2017, 1:51 PM

## 2017-08-02 NOTE — Consult Note (Signed)
Consultation Note Date: 08/02/2017   Patient Name: Melissa Savage  DOB: [birth year]  MRN: [id number]  Age / Sex: 83 y.o., female  PCP: Baxter Hire, MD Referring Physician: Bettey Costa, MD  Reason for Consultation: Establishing goals of care  HPI/Patient Profile:  Melissa Savage  is a 83 y.o. female with a known history of breast cancer, hypertension was brought to the emergency room for shortness of breath for the last 1 week and cough. Patient was treated for pneumonia by primary care physician but the symptoms got worse. Patient felt more short of breath for the last 1 week and she was hypoxic when she came to the emergency room she was put on Lanoxin by nasal cannula.    Clinical Assessment and Goals of Care: [name] is resting in bed. She is sleepy and confused. Her son is at bedside who states overnight she was grabbing for things in the air. He states she has told him she does not have much time left and that she is ready to go. He states a few months ago she would go grocery shopping and was able to get in and out of the car herself. She walked unassisted. She could complete ADLs. He states a few weeks ago she was treated for PNA. He states since then, her  oral intake has been variable, and that she has lost 10 pounds in 4 months. He states her memory has declined and that she has complained of feeling short of breath. He states yesterday she looked better than she does today. Notes from overnight state she progressively worsened overnight with medications administered to help respiratory status.  Spoke with SLP who states she was advanced to the safest diet, but still has concern there may be aspiration. Per PT notes, patient with desaturations to low 70's following ambulation with a walker, and O2 was increased to 5 lpm.      We discussed diagnosis, prognosis, GOC, EOL wishes disposition  and options.  A detailed discussion was had today regarding advanced directives.  Concepts specific to code status, artifical feeding and hydration, continued IV antibiotics and rehospitalization were discussed.  The difference between an aggressive medical intervention path and a hospice comfort care path was discussed.  Values and goals of care important to patient and family were discussed.  Son Rush Landmark states he does not want to prolong her suffering and that she is suffering. He states she has told him she is ready to go and just wants her to be comfortable. I spoke with his other brother Jori Moll who agreed with plans for hospice facility and focus on comfort.   MOST form completed for comfort measures.     2 sons Rush Landmark and Jori Moll are [employer] as patient is confused.     SUMMARY OF RECOMMENDATIONS   Inpatient hospice facility placement requested.    Code Status/Advance Care Planning:  DNR    Symptom Management:   End of life order set utilized  Morphine for pain, SOB  Ativan for  anxiety  Haldol for agitation  Rubinol for excessive secretions.   Palliative Prophylaxis:   Oral Care  Additional Recommendations (Limitations, Scope, Preferences):  Focus on comfort  Prognosis:   < 2 weeks  Discharge Planning: Hospice facility      Primary Diagnoses: Present on Admission: . HCAP (healthcare-associated pneumonia)   I have reviewed the medical record, interviewed the patient and family, and examined the patient. The following aspects are pertinent.  Past Medical History:  Diagnosis Date  . Breast cancer ([place])   . Hypertension    Social History   Socioeconomic History  . Marital status: Widowed    Spouse name: None  . Number of children: None  . Years of education: None  . Highest education level: None  Social Needs  . Financial resource strain: Not hard at all  . Food insecurity - worry: Never true  . Food insecurity - inability: Never true  .  Transportation needs - medical: No  . Transportation needs - non-medical: No  Occupational History    Employer: RETIRED  Tobacco Use  . Smoking status: Never Smoker  . Smokeless tobacco: Never Used  Substance and Sexual Activity  . Alcohol use: No    Frequency: Never  . Drug use: No  . Sexual activity: No  Other Topics Concern  . None  Social History Narrative  . None   Family History  Problem Relation Age of Onset  . Diabetes Mellitus II Neg Hx   . CAD Neg Hx    Scheduled Meds: . aspirin EC  81 mg Oral Daily  . enoxaparin (LOVENOX) injection  30 mg Subcutaneous Q24H  . furosemide  80 mg Oral Daily  . gabapentin  100 mg Oral TID  . levothyroxine  112 mcg Oral QAC breakfast  . losartan  100 mg Oral Daily  . metoprolol tartrate  25 mg Oral BID  . multivitamin with minerals  1 tablet Oral Daily  . pantoprazole  40 mg Oral Daily  . pneumococcal 23 valent vaccine  0.5 mL Intramuscular Tomorrow-1000  . sodium chloride flush  3 mL Intravenous Q12H   Continuous Infusions: . sodium chloride    . sodium chloride 75 mL/hr at 08/02/17 0927  . piperacillin-tazobactam (ZOSYN)  IV 3.375 g (08/02/17 1408)   PRN Meds:.sodium chloride, acetaminophen **OR** acetaminophen, haloperidol lactate, ipratropium-albuterol, LORazepam, ondansetron **OR** ondansetron (ZOFRAN) IV, senna-docusate, sodium chloride flush, traMADol Medications Prior to Admission:  Prior to Admission medications   Medication Sig Start Date End Date Taking? Authorizing Provider  aspirin EC 81 MG tablet Take 1 tablet by mouth daily.   Yes [provider]  furosemide (LASIX) 80 MG tablet Take 1 tablet by mouth daily. 05/04/17  Yes [provider]  levothyroxine (SYNTHROID, LEVOTHROID) 112 MCG tablet Take 1 tablet by mouth daily. 05/04/17  Yes [provider]  losartan (COZAAR) 100 MG tablet Take 1 tablet by mouth daily. 05/04/17  Yes [provider]  metoprolol tartrate (LOPRESSOR) 25 MG  tablet Take 1 tablet by mouth 2 (two) times daily. 05/04/17  Yes [provider]  traMADol (ULTRAM) 50 MG tablet Take 1 tablet by mouth 2 (two) times daily as needed. 11/16/16  Yes [provider]  gabapentin (NEURONTIN) 100 MG capsule Take 1 capsule by mouth 3 (three) times daily. 05/04/17   [provider]   No Known Allergies Review of Systems  Musculoskeletal: Positive for back pain.    Physical Exam  Constitutional: She appears ill.  HENT:  Head:  Normocephalic.  Pulmonary/Chest: Effort normal.  Neurological:  Confused  Skin: Skin is warm and dry.    Vital Signs: BP (!) 135/50 (BP Location: Left Arm)   Pulse 62   Temp (!) 97.4 F (36.3 C) (Oral)   Resp 18   Ht 4\' 11"  (1.499 m)   Wt 54.1 kg (119 lb 3.2 oz)   SpO2 100%   BMI 24.08 kg/m  Pain Assessment: PAINAD   Pain Score: 0-No pain   SpO2: SpO2: 100 % O2 Device:SpO2: 100 % O2 Flow Rate: .O2 Flow Rate (L/min): 5 L/min  IO: Intake/output summary:   Intake/Output Summary (Last 24 hours) at 08/02/2017 1651 Last data filed at 08/02/2017 1630 Gross per 24 hour  Intake 878.75 ml  Output 800 ml  Net 78.75 ml    LBM: Last BM Date: 07/31/17 Baseline Weight: Weight: 53.5 kg (118 lb) Most recent weight: Weight: 54.1 kg (119 lb 3.2 oz)     Palliative Assessment/Data:     Time In: 4:10 Time Out: 5:20 Time Total: 70 min Greater than 50%  of this time was spent counseling and coordinating care related to the above assessment and plan.  Signed by: Asencion Gowda, NP 08/02/2017 5:24 PM Office: [phone]) [phone] 7am-7pm  Please see [facility] for pager number and availability  Call primary team after hours  Please contact Palliative Medicine Team phone at [phone] for questions and concerns.  For individual provider: See Shea Evans

## 2017-08-02 NOTE — Progress Notes (Signed)
ANTIBIOTIC CONSULT NOTE - INITIAL  Pharmacy Consult for Zosyn  Indication: HCAP  No Known Allergies  Patient Measurements: Height: 4\' 11"  (149.9 cm) Weight: 119 lb 3.2 oz (54.1 kg) IBW/kg (Calculated) : 43.2 Adjusted Body Weight:   Vital Signs: Temp: 97.8 F (36.6 C) (11/29 0321) Temp Source: Oral (11/29 0321) BP: 126/59 (11/29 0609) Pulse Rate: 87 (11/29 0609) Intake/Output from previous day: 11/28 0701 - 11/29 0700 In: 720 [P.O.:720] Out: 800 [Urine:800] Intake/Output from this shift: No intake/output data recorded.  Labs: Recent Labs    07/30/17 1805  07/31/17 0404 08/01/17 0459 08/02/17 0639  WBC 16.7*  --  15.1* 10.8  --   HGB 11.8*  --  12.5 11.7*  --   PLT 221  --  194 176  --   CREATININE  --    < > 0.96 0.96 1.00   < > = values in this interval not displayed.   Estimated Creatinine Clearance: 24.2 mL/min (by C-G formula based on SCr of 1 mg/dL). No results for input(s): VANCOTROUGH, VANCOPEAK, VANCORANDOM, GENTTROUGH, GENTPEAK, GENTRANDOM, TOBRATROUGH, TOBRAPEAK, TOBRARND, AMIKACINPEAK, AMIKACINTROU, AMIKACIN in the last 72 hours.   Microbiology: Recent Results (from the past 720 hour(s))  Blood Culture (routine x 2)     Status: None (Preliminary result)   Collection Time: 07/31/17  2:03 AM  Result Value Ref Range Status   Specimen Description BLOOD LT FOREARM  Final   Special Requests   Final    BOTTLES DRAWN AEROBIC AND ANAEROBIC Blood Culture adequate volume   Culture NO GROWTH 2 DAYS  Final   Report Status PENDING  Incomplete  Blood Culture (routine x 2)     Status: None (Preliminary result)   Collection Time: 07/31/17  2:03 AM  Result Value Ref Range Status   Specimen Description BLOOD LT WRIST  Final   Special Requests   Final    BOTTLES DRAWN AEROBIC AND ANAEROBIC Blood Culture adequate volume   Culture NO GROWTH 2 DAYS  Final   Report Status PENDING  Incomplete  MRSA PCR Screening     Status: None   Collection Time: 07/31/17  7:19 AM   Result Value Ref Range Status   MRSA by PCR NEGATIVE NEGATIVE Final    Comment:        The GeneXpert MRSA Assay (FDA approved for NASAL specimens only), is one component of a comprehensive MRSA colonization surveillance program. It is not intended to diagnose MRSA infection nor to guide or monitor treatment for MRSA infections.     Medical History: Past Medical History:  Diagnosis Date  . Breast cancer ([place])   . Hypertension     Medications:  Infusions:  . sodium chloride    . sodium chloride 75 mL/hr at 08/02/17 0927  . piperacillin-tazobactam (ZOSYN)  IV     Assessment: 89 yof presents to ED with SOB, treated for PNA 2 weeks ago.  Pharmacy was consulted to dose cefepime and vancomycin for HCAP. Vanc and cefepime has been discontinued. Pharmacy to dose Zosyn for possible aspiration PNA.   MRSA PCR (-) 11/27  Plan:  Start Zosyn 3.375 IV EI every 8 hours  Treylan Mcclintock M [place] Current, Pharm.D., BCPS Clinical Pharmacist 08/02/2017,11:48 AM

## 2017-08-02 NOTE — Progress Notes (Addendum)
Physical Therapy Treatment Patient Details Name: Melissa Savage MRN: [id number] DOB: 1918-03-04 Today's Date: 08/02/2017    History of Present Illness Pt admitted for HCAP as well as pneumonia. Pt complaints of SOB with cough x 1 week. History includes breast cancer and HTN.     PT Comments    Pt in bed, agrees to session.  O2 sats at rest 97% P 62 on 4 lpm.  Pt to edge of bed with min assist.  Once sitting she can remain sitting without assist.  Stood with min guard but needed min assist to gain balance once standing.  She was able to ambulate 66' with walker and min assist with generally unsteady gait.  High step height bilaterally.  Pt with imbalances and needed verbal correction and cues to stop and gain balance.  Once sitting O2 checked.  Initially low 90's but with continued monitoring progressively decreased to low 70's.  Pt was increased to 5 lpm and primary nurse notified and came to assist.  She was taken back to room and assisted to supine.  Proper breathing techniques encouraged but she continued to mouth breath despite cues.  O2 slowly increased and pt was at 89% on 5 lpm HR 51.  Appeared comfortable.   Follow Up Recommendations  SNF     Equipment Recommendations  Rolling walker with 5" wheels    Recommendations for Other Services       Precautions / Restrictions Precautions Precautions: Fall Restrictions Weight Bearing Restrictions: No    Mobility  Bed Mobility Overal bed mobility: Needs Assistance Bed Mobility: Sit to Supine;Supine to Sit     Supine to sit: Min assist Sit to supine: Min assist      Transfers Overall transfer level: Needs assistance Equipment used: Rolling walker (2 wheeled) Transfers: Sit to/from Stand Sit to Stand: Min guard            Ambulation/Gait Ambulation/Gait assistance: Min assist;Mod assist Ambulation Distance (Feet): 35 Feet Assistive device: Rolling walker (2 wheeled) Gait Pattern/deviations: Step-through  pattern;Decreased step length - right;Decreased step length - left   Gait velocity interpretation: Below normal speed for age/gender General Gait Details: Generally unsteady and poor awareness/self correction of imbalances.  High fall risk.   Stairs            Wheelchair Mobility    Modified Rankin (Stroke Patients Only)       Balance Overall balance assessment: Needs assistance Sitting-balance support: Feet supported Sitting balance-Leahy Scale: Good     Standing balance support: No upper extremity supported;Bilateral upper extremity supported Standing balance-Leahy Scale: Poor                              Cognition Arousal/Alertness: Awake/alert Behavior During Therapy: WFL for tasks assessed/performed Overall Cognitive Status: Within Functional Limits for tasks assessed                                        Exercises      General Comments        Pertinent Vitals/Pain Pain Assessment: No/denies pain    Home Living                      Prior Function            PT Goals (current goals can now be found in  the care plan section) Progress towards PT goals: Progressing toward goals    Frequency    Min 2X/week      PT Plan Discharge plan needs to be updated    Co-evaluation              AM-PAC PT "6 Clicks" Daily Activity  Outcome Measure  Difficulty turning over in bed (including adjusting bedclothes, sheets and blankets)?: A Little Difficulty moving from lying on back to sitting on the side of the bed? : A Little Difficulty sitting down on and standing up from a chair with arms (e.g., wheelchair, bedside commode, etc,.)?: A Little Help needed moving to and from a bed to chair (including a wheelchair)?: A Lot Help needed walking in hospital room?: A Lot Help needed climbing 3-5 steps with a railing? : A Lot 6 Click Score: 15    End of Session Equipment Utilized During Treatment: Gait  belt Activity Tolerance: Patient limited by fatigue;Treatment limited secondary to medical complications (Comment) Patient left: in bed;with bed alarm set;with call bell/phone within reach;with family/visitor present Nurse Communication: Other (comment)       Time: 6237-6283 PT Time Calculation (min) (ACUTE ONLY): 17 min  Charges:  $Gait Training: 8-22 mins                    G Codes:      Chesley Noon, PTA 08/02/17, 12:18 PM

## 2017-08-02 NOTE — Progress Notes (Signed)
SLP Cancellation Note  Patient Details Name: Melissa Savage MRN: [id number] DOB: 03/12/1918   Cancelled treatment:       Reason Eval/Treat Not Completed: Medical issues which prohibited therapy;Fatigue/lethargy limiting ability to participate(chart reviewed; consulted NSG). Pt's medical status declined last PM per NSG report w/ pt requiring morphine, Ativan, and Haldol and breathing tx. Currently, pt is weak, fatigued/lethargic. NSG reported Son attempted assisting pt during lunch meal and coughing was noted w/ puree foods (pudding) and the Nectar consistency liquids as recommended by MBSS yesterday. MD is aware; Palliative Care consult has been ordered. Will f/u w/ pt's toleration of dysphagia diet; education on aspiration precautions; recommendations post Palliative Care consult. Recommend any oral intake only if pt is fully alert/awake to safely participate and to stop any oral intake if increased s/s of aspiration noted (make MD aware). NSG agreed.    Orinda Kenner, [place], CCC-SLP Berl Bonfanti 08/02/2017, 4:56 PM

## 2017-08-02 NOTE — Plan of Care (Signed)
  Education: Knowledge of General Education information will improve 08/02/2017 0226 - Not Progressing by Jeffie Pollock, RN   Health Behavior/Discharge Planning: Ability to manage health-related needs will improve 08/02/2017 0226 - Not Progressing by Jeffie Pollock, RN   Clinical Measurements: Ability to maintain clinical measurements within normal limits will improve 08/02/2017 0226 - Not Progressing by Jeffie Pollock, RN   Clinical Measurements: Will remain free from infection 08/02/2017 0226 - Not Progressing by Jeffie Pollock, RN   Clinical Measurements: Diagnostic test results will improve 08/02/2017 0226 - Not Progressing by Jeffie Pollock, RN   Clinical Measurements: Diagnostic test results will improve 08/02/2017 0226 - Not Progressing by Jeffie Pollock, RN   Clinical Measurements: Respiratory complications will improve 08/02/2017 0226 - Not Progressing by Jeffie Pollock, RN   Clinical Measurements: Cardiovascular complication will be avoided 08/02/2017 0226 - Not Progressing by Jeffie Pollock, RN   Activity: Risk for activity intolerance will decrease 08/02/2017 0226 - Not Progressing by Jeffie Pollock, RN   Nutrition: Adequate nutrition will be maintained 08/02/2017 0226 - Not Progressing by Jeffie Pollock, RN   Coping: Level of anxiety will decrease 08/02/2017 0226 - Not Progressing by Jeffie Pollock, RN   Elimination: Will not experience complications related to bowel motility 08/02/2017 0226 - Not Progressing by Jeffie Pollock, RN   Elimination: Will not experience complications related to urinary retention 08/02/2017 0226 - Not Progressing by Jeffie Pollock, RN   Pain Managment: General experience of comfort will improve 08/02/2017 0226 - Not Progressing by Jeffie Pollock, RN   Safety: Ability to remain free from injury will improve 08/02/2017 0226 - Not Progressing by  Jeffie Pollock, RN   Skin Integrity: Risk for impaired skin integrity will decrease 08/02/2017 0226 - Not Progressing by Jeffie Pollock, RN   Activity: Ability to tolerate increased activity will improve 08/02/2017 0226 - Not Progressing by Jeffie Pollock, RN   Clinical Measurements: Ability to maintain a body temperature in the normal range will improve 08/02/2017 0226 - Not Progressing by Jeffie Pollock, RN   Respiratory: Ability to maintain adequate ventilation will improve 08/02/2017 0226 - Not Progressing by Jeffie Pollock, RN   Respiratory: Ability to maintain a clear airway will improve 08/02/2017 0226 - Not Progressing by Jeffie Pollock, RN

## 2017-08-02 NOTE — Care Management Important Message (Signed)
Important Message  Patient Details  Name: Melissa Savage MRN: [id number] Date of Birth: Aug 27, 1918   Medicare Important Message Given:  Yes    Shelbie Ammons, RN 08/02/2017, 7:29 AM

## 2017-08-02 NOTE — Progress Notes (Signed)
Nutrition Brief Note  Patient now on dysphagia 2 diet with nectar-thick liquids.   As Ensure Enlive is not nectar-thick will discontinue order.  Recommend Hormel Shake BID with meals, each supplement provides 520 kcals and 22 grams of protein.  Continue daily MVI.  Willey Blade, MS, [place], LDN Office: [phone] Pager: [phone] After Hours/Weekend Pager: [phone]

## 2017-08-02 NOTE — NC FL2 (Signed)
[place] LEVEL OF CARE SCREENING TOOL     IDENTIFICATION  Patient Name: Melissa Savage Birthdate: 26-Jul-1918 Sex: female Admission Date (Current Location): 07/30/2017  [place] and [place] Number:  Engineering geologist and Address:  [facility], [address] [place], [place] [address]      Provider Number: [id number]  Attending Physician Name and Address:  Bettey Costa, MD  Relative Name and Phone Number:       Current Level of Care: Hospital Recommended Level of Care: [place] Prior Approval Number:    Date Approved/Denied:   PASRR Number: [id number])  Discharge Plan: SNF    Current Diagnoses: Patient Active Problem List   Diagnosis Date Noted  . HCAP (healthcare-associated pneumonia) 07/31/2017    Orientation RESPIRATION BLADDER Height & Weight     Self, Situation  O2(5 Liters Oxygen ) Continent Weight: 119 lb 3.2 oz (54.1 kg) Height:  4\' 11"  (149.9 cm)  BEHAVIORAL SYMPTOMS/MOOD NEUROLOGICAL BOWEL NUTRITION STATUS      Continent Diet(Dys 2. )  AMBULATORY STATUS COMMUNICATION OF NEEDS Skin   Extensive Assist Verbally Normal                       Personal Care Assistance Level of Assistance  Bathing, Feeding, Dressing Bathing Assistance: Limited assistance Feeding assistance: Independent Dressing Assistance: Limited assistance     Functional Limitations Info  Sight, Hearing, Speech Sight Info: Adequate Hearing Info: Adequate Speech Info: Adequate    SPECIAL CARE FACTORS FREQUENCY  PT (By licensed PT), OT (By licensed OT)     PT Frequency: (5) OT Frequency: (5)            Contractures      Additional Factors Info  Code Status, Allergies Code Status Info: (DNR ) Allergies Info: (No Known Allergies. )           Current Medications (08/02/2017):  This is the current hospital active medication list Current Facility-Administered Medications  Medication Dose Route  Frequency Provider Last Rate Last Dose  . 0.9 %  sodium chloride infusion  250 mL Intravenous PRN Pyreddy, Pavan, MD      . 0.9 %  sodium chloride infusion   Intravenous Continuous Bettey Costa, MD 75 mL/hr at 08/02/17 0927    . acetaminophen (TYLENOL) tablet 650 mg  650 mg Oral Q6H PRN Saundra Shelling, MD       Or  . acetaminophen (TYLENOL) suppository 650 mg  650 mg Rectal Q6H PRN Pyreddy, Reatha Harps, MD      . aspirin EC tablet 81 mg  81 mg Oral Daily Pyreddy, Reatha Harps, MD   81 mg at 08/02/17 0927  . enoxaparin (LOVENOX) injection 30 mg  30 mg Subcutaneous Q24H Pyreddy, Reatha Harps, MD   30 mg at 08/01/17 2139  . furosemide (LASIX) tablet 80 mg  80 mg Oral Daily Harrie Foreman, MD   80 mg at 08/02/17 6967  . gabapentin (NEURONTIN) capsule 100 mg  100 mg Oral TID Saundra Shelling, MD   100 mg at 08/02/17 0927  . haloperidol lactate (HALDOL) injection 2 mg  2 mg Intravenous Q6H PRN Harrie Foreman, MD   2 mg at 08/02/17 0517  . ipratropium-albuterol (DUONEB) 0.5-2.5 (3) MG/3ML nebulizer solution 3 mL  3 mL Nebulization Q4H PRN Bettey Costa, MD   3 mL at 08/02/17 0248  . levothyroxine (SYNTHROID, LEVOTHROID) tablet 112 mcg  112 mcg Oral QAC breakfast Saundra Shelling, MD  112 mcg at 08/02/17 0927  . LORazepam (ATIVAN) tablet 1 mg  1 mg Oral Q4H PRN Harrie Foreman, MD   1 mg at 08/01/17 2350  . losartan (COZAAR) tablet 100 mg  100 mg Oral Daily Pyreddy, Reatha Harps, MD   100 mg at 08/02/17 0927  . metoprolol tartrate (LOPRESSOR) tablet 25 mg  25 mg Oral BID Saundra Shelling, MD   25 mg at 08/02/17 0927  . multivitamin with minerals tablet 1 tablet  1 tablet Oral Daily Bettey Costa, MD   1 tablet at 08/02/17 0927  . ondansetron (ZOFRAN) tablet 4 mg  4 mg Oral Q6H PRN Pyreddy, Reatha Harps, MD       Or  . ondansetron (ZOFRAN) injection 4 mg  4 mg Intravenous Q6H PRN Pyreddy, Pavan, MD      . pantoprazole (PROTONIX) EC tablet 40 mg  40 mg Oral Daily Bettey Costa, MD   40 mg at 08/02/17 0927  . piperacillin-tazobactam (ZOSYN)  IVPB 3.375 g  3.375 g Intravenous Q8H Hallaji, Sheema M, RPH      . pneumococcal 23 valent vaccine (PNU-IMMUNE) injection 0.5 mL  0.5 mL Intramuscular Tomorrow-1000 Pyreddy, Pavan, MD      . senna-docusate (Senokot-S) tablet 1 tablet  1 tablet Oral QHS PRN Pyreddy, Reatha Harps, MD      . sodium chloride flush (NS) 0.9 % injection 3 mL  3 mL Intravenous Q12H Saundra Shelling, MD   3 mL at 08/02/17 0927  . sodium chloride flush (NS) 0.9 % injection 3 mL  3 mL Intravenous PRN Pyreddy, Pavan, MD      . traMADol (ULTRAM) tablet 50 mg  50 mg Oral Q12H PRN Saundra Shelling, MD         Discharge Medications: Please see discharge summary for a list of discharge medications.  Relevant Imaging Results:  Relevant Lab Results:   Additional Information (SSN: [id number])  Althia Egolf, [place] Beets, LCSW

## 2017-08-02 NOTE — Progress Notes (Signed)
New hospice home referral received from [place] following a Palliative Medicine consult. Writer to follow up with family tomorrow and update CSW.  Flo Shanks RN BSN, [place] and [facility] [place], hospital Liaison [phone]

## 2017-08-02 NOTE — Progress Notes (Signed)
Clinical Education officer, museum (CSW) contacted patient's son Abe People and presented bed offers. Son chose [facility]. [facility] admissions coordinator at [facility] is aware of accepted bed offer.   [employer], LCSW [phone]

## 2017-08-02 NOTE — Progress Notes (Signed)
[place] [facility] [place] NAME: Melissa Savage    MR#:  [id number]  DATE OF BIRTH:  27-Jan-1918  SUBJECTIVE:   Nurse reports the patient did not sleep at all last night. She is currently sleeping. Son is at bedside. Son reports she had an average day yesterday. She was able to eat breakfast and lunch however started having some confusion and hallucinations last night.  REVIEW OF SYSTEMS:    Patient is sleeping    Tolerating Diet: yes      DRUG ALLERGIES:  No Known Allergies  VITALS:  Blood pressure (!) 126/59, pulse 87, temperature 97.8 F (36.6 C), temperature source Oral, resp. rate (!) 26, height 4\' 11"  (1.499 m), weight 54.1 kg (119 lb 3.2 oz), SpO2 98 %.  PHYSICAL EXAMINATION:  Constitutional: Appears frail and thin. No distress. HENT: Normocephalic. Marland Kitchen Oropharynx is clear and moist.  Eyes: Conjunctivae  normal. no scleral icterus.  Neck: Normal ROM. Neck supple. No JVD. No tracheal deviation. CVS: RRR, S1/S2 +, no murmurs, no gallops, no carotid bruit.  Pulmonary: Decreased breath sounds throughout lung fields no wheezing or crackles. Abdominal: Soft. BS +,  no distension, tenderness, rebound or guarding.  Musculoskeletal: No edema and no tenderness.  Neuro: Patient sleeping soundly Skin: Skin is warm and dry. No rash noted. Psychiatric: Sleeping soundly      LABORATORY PANEL:   CBC Recent Labs  Lab 08/01/17 0459  WBC 10.8  HGB 11.7*  HCT 33.9*  PLT 176   ------------------------------------------------------------------------------------------------------------------  Chemistries  Recent Labs  Lab 08/02/17 0639  NA 135  K 4.5  CL 98*  CO2 28  GLUCOSE 165*  BUN 31*  CREATININE 1.00  CALCIUM 8.5*   ------------------------------------------------------------------------------------------------------------------  Cardiac Enzymes Recent Labs  Lab 07/31/17 0404 07/31/17 1003 07/31/17 1549   TROPONINI 0.06* 0.06* 0.06*   ------------------------------------------------------------------------------------------------------------------  RADIOLOGY:  No results found.   ASSESSMENT AND PLAN:    83 year old female with history of breast cancer and essential hypertension who presents with shortness of breath and cough.  1. Sepsis: Patient presented with leukocytosis and tachypnea. Sepsis is due to pneumonia.  2. Multifocal pneumonia with dense upper lobe consolidation: Continue cefepime MSRA PCR was negative so Vancomycin was discontinued.   Patient underwent modified barium study. She is at moderate aspiration risk. Continue diet as per recommendations of speech. Continue PPI due to regurgitation.  3. Hypothyroid: Continue Synthroid  4. Essential hypertension: Continue losartan and metoprolol   5. Elevated troponin: This is due to demand ischemia from pneumonia and not ACS.   Palliative care consulted for goals of care  Management plans discussed with the patient and son and they are in agreement.  CODE STATUS: DNR  TOTAL TIME TAKING CARE OF THIS PATIENT: 28 minutes.     POSSIBLE D/C 2-4 days, DEPENDING ON CLINICAL CONDITION.   Melissa Savage M.D on 08/02/2017 at 10:37 AM  Between 7am to 6pm - Pager - [phone] After 6pm go to [email or web address] - password [email or web address] [place] [facility]  Office  [phone]  CC: Primary care physician; Melissa Hire, MD  Note: This dictation was prepared with Dragon dictation along with smaller phrase technology. Any transcriptional errors that result from this process are unintentional.

## 2017-08-02 NOTE — Progress Notes (Signed)
Notified Dr Marcille Blanco while he was up here for another patient. Orders given to bladder scan patient and in and out cath if > 400. Haldol 2 mg ordered too. Pt son Rush Landmark notified and updated on pt current condition. Pt currently resting. Will continue to monitor.

## 2017-08-02 NOTE — Progress Notes (Signed)
1. Pt condition has progressively worsened throughout the night. Earlier this Probation officer spoke with Dr Jannifer Franklin and pt was given Morphine 1 mg to help with her respiratory status. Pt was also given Ativan po prior to the Morphine. Later on Cardiopulmonary was notified for prn Duoneb treatment.  2. Pt was on daily doses of Furosemide 80 mg daily  prior to admission. This medication was not scheduled since pt admission. Dr Marcille Blanco came up to see pt around 3 am. Furosemide was ordered 40 mg once IV push and 80 mg daily po. 3. Furthermore, an External catheter was placed because pt too weak to get OOB. Pt was getting out of bed with one person assist prior to her decline.

## 2017-08-03 MED ORDER — SODIUM CHLORIDE 0.9 % IV SOLN
1.0000 mg/h | INTRAVENOUS | Status: DC
  Filled 2017-08-03: qty 10

## 2017-08-03 MED ORDER — CHLORHEXIDINE GLUCONATE 0.12 % MT SOLN
15.0000 mL | Freq: Two times a day (BID) | OROMUCOSAL | Status: DC
  Administered 2017-08-03: 15 mL via OROMUCOSAL
  Filled 2017-08-03: qty 15

## 2017-08-03 MED ORDER — MORPHINE SULFATE (PF) 2 MG/ML IV SOLN: 1.0000 mg | INTRAVENOUS | Status: DC | PRN

## 2017-08-03 MED ORDER — ORAL CARE MOUTH RINSE: 15.0000 mL | Freq: Two times a day (BID) | OROMUCOSAL | Status: DC

## 2017-08-03 MED ORDER — MORPHINE SULFATE (PF) 2 MG/ML IV SOLN: 2.0000 mg | INTRAVENOUS | Status: DC | PRN

## 2017-08-03 MED ORDER — LORAZEPAM 2 MG/ML PO CONC: 1.0000 mg | ORAL | 0 refills | Status: AC | PRN

## 2017-08-03 MED ORDER — GLYCOPYRROLATE 1 MG PO TABS: 1.0000 mg | ORAL_TABLET | ORAL | Status: AC | PRN

## 2017-08-03 MED ORDER — MORPHINE SULFATE (PF) 2 MG/ML IV SOLN: 1.0000 mg | INTRAVENOUS | 0 refills | Status: AC | PRN

## 2017-08-03 MED ORDER — MORPHINE BOLUS VIA INFUSION
1.0000 mg | INTRAVENOUS | Status: DC | PRN
  Filled 2017-08-03: qty 1

## 2017-08-03 NOTE — Plan of Care (Signed)
  Education: Knowledge of General Education information will improve 08/03/2017 0328 - Not Progressing by Jeffie Pollock, RN   Health Behavior/Discharge Planning: Ability to manage health-related needs will improve 08/03/2017 0328 - Not Progressing by Jeffie Pollock, RN   Clinical Measurements: Ability to maintain clinical measurements within normal limits will improve 08/03/2017 0328 - Not Progressing by Jeffie Pollock, RN Will remain free from infection 08/03/2017 0328 - Progressing by Jeffie Pollock, RN Diagnostic test results will improve 08/03/2017 0328 - Not Progressing by Jeffie Pollock, RN Respiratory complications will improve 08/03/2017 0328 - Not Progressing by Jeffie Pollock, RN Cardiovascular complication will be avoided 08/03/2017 0328 - Progressing by Jeffie Pollock, RN   Activity: Risk for activity intolerance will decrease 08/03/2017 0328 - Not Progressing by Jeffie Pollock, RN   Nutrition: Adequate nutrition will be maintained 08/03/2017 0328 - Not Progressing by Jeffie Pollock, RN   Coping: Level of anxiety will decrease 08/03/2017 0328 - Progressing by Jeffie Pollock, RN   Elimination: Will not experience complications related to bowel motility 08/03/2017 0328 - Not Progressing by Jeffie Pollock, RN Will not experience complications related to urinary retention 08/03/2017 0328 - Not Progressing by Jeffie Pollock, RN   Pain Managment: General experience of comfort will improve 08/03/2017 0328 - Progressing by Jeffie Pollock, RN   Safety: Ability to remain free from injury will improve 08/03/2017 0328 - Progressing by Jeffie Pollock, RN   Skin Integrity: Risk for impaired skin integrity will decrease 08/03/2017 0328 - Progressing by Jeffie Pollock, RN   Activity: Ability to tolerate increased activity will improve 08/03/2017 0328 - Not Progressing by Jeffie Pollock,  RN   Clinical Measurements: Ability to maintain a body temperature in the normal range will improve 08/03/2017 0328 - Progressing by Jeffie Pollock, RN   Respiratory: Ability to maintain adequate ventilation will improve 08/03/2017 0328 - Progressing by Jeffie Pollock, RN Ability to maintain a clear airway will improve 08/03/2017 0328 - Progressing by Jeffie Pollock, RN   Clinical Measurements: Quality of life will improve 08/03/2017 0328 - Not Progressing by Jeffie Pollock, RN   Role Relationship: Family's ability to cope with current situation will improve 08/03/2017 0328 - Progressing by Jeffie Pollock, RN Ability to verbalize concerns, feelings, and thoughts to partner or family member will improve 08/03/2017 0328 - Progressing by Jeffie Pollock, RN

## 2017-08-03 NOTE — Progress Notes (Signed)
Clinical Social Worker (CSW) received a call on yesterday 08/02/17 from the Palliative NP stating that plan is for patient to D/C to [facility]. CSW met with patient's son Abe People at bedside who confirmed this plan. Son chose [facility]. Per [place] liaison patient can come today to the [facility]. CSW completed EMS form. RN aware of above. Please reconsult if future social work needs arise. CSW signing off.   [employer], LCSW [phone]

## 2017-08-03 NOTE — Progress Notes (Signed)
New Hospice home referral received from [place] following a Palliative Medicine consult. Patient is a 83 year old woman with a known history of breast ca, HTN, A fib, aortic valve stenosis, degenerative arthritis and  COPD admitted to [facility] from home on 11/26 for treatment of Pneumonia. She has continued with decline despite medical interventions, now requiring oxygen at 5 liters. Palliative Medicine was consulted for Goal of care and met with patient's son and daughter in law, they have chosen to focus on comfort with transfer to the Hospice home for end of life. Writer met in the room with patient's son Abe People and daughter in law to initiate education regarding hospice services, philosophy and team approach to care with  Good understanding voiced. Questions answered, consents signed. Hospice information and contact numbers given to [facility].Patient information faxed to referral. Report called to the hospice home. EMS notified for transport. Hospital care team all aware. Signed DNR in place. Thank you. Flo Shanks RN, BSN, [facility] and [facility] [place], hospital Liaison [phone] c

## 2017-08-03 NOTE — Discharge Summary (Signed)
[facility] NAME: Melissa Savage    MR#:  [id number]  DATE OF BIRTH:  1918/06/17  DATE OF ADMISSION:  07/30/2017 ADMITTING PHYSICIAN: Saundra Shelling, MD  DATE OF DISCHARGE: 08/03/2017  PRIMARY CARE PHYSICIAN: Baxter Hire, MD    ADMISSION DIAGNOSIS:  Acute respiratory failure with hypoxia ([place]) [J96.01] HCAP (healthcare-associated pneumonia) [J18.9]  DISCHARGE DIAGNOSIS:  Active Problems:   HCAP (healthcare-associated pneumonia)   SECONDARY DIAGNOSIS:   Past Medical History:  Diagnosis Date  . Breast cancer ([place])   . Hypertension     HOSPITAL COURSE:   83 year old female with history of breast cancer and essential hypertension who presents with shortness of breath and cough.  1. Sepsis: Patient presented with leukocytosis and tachypnea. Sepsis was due to pneumonia.  2. Multifocal pneumonia with dense upper lobe consolidation: She was on cefepime. Due to deterioration in her respiratory status and mental health palliative care was consulted. Family history subsided on hospice.  3. Hypothyroid:  4. Essential hypertension:  5. Elevated troponin: This is due to demand ischemia from pneumonia and not ACS.      DISCHARGE CONDITIONS AND DIET:   Guarded condition stable for discharge diet as tolerated  CONSULTS OBTAINED:    DRUG ALLERGIES:  No Known Allergies  DISCHARGE MEDICATIONS:   Allergies as of 08/03/2017   No Known Allergies     Medication List    STOP taking these medications   aspirin EC 81 MG tablet   furosemide 80 MG tablet Commonly known as:  LASIX   gabapentin 100 MG capsule Commonly known as:  NEURONTIN   levothyroxine 112 MCG tablet Commonly known as:  SYNTHROID, LEVOTHROID   losartan 100 MG tablet Commonly known as:  COZAAR   metoprolol tartrate 25 MG tablet Commonly known as:  LOPRESSOR   traMADol 50 MG tablet Commonly known as:  ULTRAM     TAKE these  medications   glycopyrrolate 1 MG tablet Commonly known as:  ROBINUL Take 1 tablet (1 mg total) by mouth every 4 (four) hours as needed (excessive secretions).   LORazepam 2 MG/ML concentrated solution Commonly known as:  ATIVAN Place 0.5 mLs (1 mg total) under the tongue every 4 (four) hours as needed for anxiety.   morphine 2 MG/ML injection Inject 0.5 mLs (1 mg total) into the vein every hour as needed (dyspnea, air hunger).         Today   CHIEF COMPLAINT:  Family has decided on hospice due to decline in patient's mental status and respiratory status.   VITAL SIGNS:  Blood pressure (!) 131/48, pulse 70, temperature 98 F (36.7 C), temperature source Oral, resp. rate (!) 24, height 4\' 11"  (1.499 m), weight 54.1 kg (119 lb 3.2 oz), SpO2 100 %.   REVIEW OF SYSTEMS:  Review of Systems  Unable to perform ROS: Acuity of condition     PHYSICAL EXAMINATION:  GENERAL:  83 y.o.-year-old patient lying in the bed critically ill appearing Shallow breaths  NECK:  Supple, no jugular venous distention. No thyroid enlargement, no tenderness.  LUNGS: decreased throughout with /l rhonchii  CARDIOVASCULAR: tachycardic. No murmurs, rubs, or gallops.  ABDOMEN: Soft, non-tender, non-distended. Bowel sounds present. No organomegaly or mass.  EXTREMITIES: No pedal edema, cyanosis, or clubbing.  PSYCHIATRIC: The patient is lethargic  SKIN: No obvious rash, lesion, or ulcer.   DATA REVIEW:   CBC Recent Labs  Lab 08/01/17 0459  WBC 10.8  HGB 11.7*  HCT  33.9*  PLT 176    Chemistries  Recent Labs  Lab 08/02/17 0639  NA 135  K 4.5  CL 98*  CO2 28  GLUCOSE 165*  BUN 31*  CREATININE 1.00  CALCIUM 8.5*    Cardiac Enzymes Recent Labs  Lab 07/31/17 0404 07/31/17 1003 07/31/17 1549  TROPONINI 0.06* 0.06* 0.06*    Microbiology Results  @MICRORSLT48 @  RADIOLOGY:  No results found.    Allergies as of 08/03/2017   No Known Allergies     Medication List    STOP  taking these medications   aspirin EC 81 MG tablet   furosemide 80 MG tablet Commonly known as:  LASIX   gabapentin 100 MG capsule Commonly known as:  NEURONTIN   levothyroxine 112 MCG tablet Commonly known as:  SYNTHROID, LEVOTHROID   losartan 100 MG tablet Commonly known as:  COZAAR   metoprolol tartrate 25 MG tablet Commonly known as:  LOPRESSOR   traMADol 50 MG tablet Commonly known as:  ULTRAM     TAKE these medications   glycopyrrolate 1 MG tablet Commonly known as:  ROBINUL Take 1 tablet (1 mg total) by mouth every 4 (four) hours as needed (excessive secretions).   LORazepam 2 MG/ML concentrated solution Commonly known as:  ATIVAN Place 0.5 mLs (1 mg total) under the tongue every 4 (four) hours as needed for anxiety.   morphine 2 MG/ML injection Inject 0.5 mLs (1 mg total) into the vein every hour as needed (dyspnea, air hunger).          Management plans discussed with the patient's son and he is in agreement. Stable for discharge hospice  Patient should follow up with hospice  CODE STATUS:     Code Status Orders  (From admission, onward)        Start     Ordered   08/02/17 1719  Do not attempt resuscitation (DNR)  Continuous    Question Answer Comment  In the event of cardiac or respiratory ARREST Do not call a "code blue"   In the event of cardiac or respiratory ARREST Do not perform Intubation, CPR, defibrillation or ACLS   In the event of cardiac or respiratory ARREST Use medication by any route, position, wound care, and other measures to relive pain and suffering. May use oxygen, suction and manual treatment of airway obstruction as needed for comfort.      08/02/17 1719    Code Status History    Date Active Date Inactive Code Status Order ID Comments User Context   07/31/2017 02:50 08/02/2017 17:19 DNR [id number]  Saundra Shelling, MD ED    Advance Directive Documentation     Most Recent Value  Type of Advance Directive  Living will,  Healthcare Power of Attorney  Pre-existing out of facility DNR order (yellow form or pink MOST form)  No data  "MOST" Form in Place?  No data      TOTAL TIME TAKING CARE OF THIS PATIENT: 38 minutes.    Note: This dictation was prepared with Dragon dictation along with smaller phrase technology. Any transcriptional errors that result from this process are unintentional.  Dorrell Mitcheltree M.D on 08/03/2017 at 9:29 AM  Between 7am to 6pm - Pager - [phone] After 6pm go to [email or web address] - password [email or web address] [facility]  Office  [phone]  CC: Primary care physician; Baxter Hire, MD

## 2017-08-03 NOTE — Plan of Care (Signed)
  Progressing Education: Knowledge of General Education information will improve 08/03/2017 1124 - Progressing by Rowe Robert, RN Clinical Measurements: Will remain free from infection 08/03/2017 1124 - Progressing by Rowe Robert, RN Respiratory complications will improve 08/03/2017 1124 - Progressing by Rowe Robert, RN Note Comfort measures.  Morphine and ativan given and air hunger decreased Cardiovascular complication will be avoided 08/03/2017 1124 - Progressing by Rowe Robert, RN Activity: Risk for activity intolerance will decrease 08/03/2017 1124 - Progressing by Rowe Robert, RN Coping: Level of anxiety will decrease 08/03/2017 1124 - Progressing by Rowe Robert, RN Pain Managment: General experience of comfort will improve 08/03/2017 1124 - Progressing by Rowe Robert, RN Safety: Ability to remain free from injury will improve 08/03/2017 1124 - Progressing by Rowe Robert, RN Skin Integrity: Risk for impaired skin integrity will decrease 08/03/2017 1124 - Progressing by Rowe Robert, RN Activity: Ability to tolerate increased activity will improve 08/03/2017 1124 - Progressing by Rowe Robert, RN Clinical Measurements: Ability to maintain a body temperature in the normal range will improve 08/03/2017 1124 - Progressing by Rowe Robert, RN Respiratory: Ability to maintain adequate ventilation will improve 08/03/2017 1124 - Progressing by Rowe Robert, RN Ability to maintain a clear airway will improve 08/03/2017 1124 - Progressing by Rowe Robert, RN Clinical Measurements: Quality of life will improve 08/03/2017 1124 - Progressing by Rowe Robert, RN Role Relationship: Family's ability to cope with current situation will improve 08/03/2017 1124 - Progressing by Rowe Robert, RN Ability to verbalize concerns, feelings, and thoughts to partner or family member will improve 08/03/2017 1124 - Progressing by Rowe Robert, RN

## 2017-08-03 NOTE — Progress Notes (Cosign Needed)
Daily Progress Note   Patient Name: Melissa Savage       Date: 08/03/2017 DOB: 22-Jan-1918  Age: 83 y.o. MRN#: [id number] Attending Physician: Bettey Costa, MD Primary Care Physician: Baxter Hire, MD Admit Date: 07/30/2017  Reason for Consultation/Follow-up: Establishing goals of care Hospice facility  Subjective: Melissa Savage is confused, restless, with audible wheezing and dyspnea.  RN at bedside administering Ativan, Robinol, and Morphine. Patient became more comfortable, and is resting at this time. Awaiting a hospice bed at this time. Son, Mr. Armistead states she had a rough night last night and needed medications about every 2 hours.   Length of Stay: 3  Current Medications: Scheduled Meds:  . chlorhexidine  15 mL Mouth Rinse BID  . furosemide  80 mg Oral Daily  . gabapentin  100 mg Oral TID  . levothyroxine  112 mcg Oral QAC breakfast  . losartan  100 mg Oral Daily  . mouth rinse  15 mL Mouth Rinse q12n4p  . metoprolol tartrate  25 mg Oral BID  . pantoprazole  40 mg Oral Daily  . pneumococcal 23 valent vaccine  0.5 mL Intramuscular Tomorrow-1000  . sodium chloride flush  3 mL Intravenous Q12H  . sodium chloride flush  3 mL Intravenous Q12H    Continuous Infusions: . sodium chloride    . sodium chloride    . morphine      PRN Meds: sodium chloride, sodium chloride, acetaminophen **OR** acetaminophen, antiseptic oral rinse, glycopyrrolate **OR** [DISCONTINUED] glycopyrrolate **OR** glycopyrrolate, [DISCONTINUED] haloperidol **OR** haloperidol **OR** haloperidol lactate, ipratropium-albuterol, [DISCONTINUED] LORazepam **OR** LORazepam **OR** LORazepam, morphine, ondansetron **OR** ondansetron (ZOFRAN) IV, ondansetron **OR** ondansetron (ZOFRAN) IV, polyvinyl alcohol,  senna-docusate, sodium chloride flush, sodium chloride flush  Physical Exam  Constitutional: She appears distressed.  Into room, patient appears restless and distressed with audible wheezing and dyspnea.  Following medication, patient is resting comfortably.               Vital Signs: BP (!) 131/48 (BP Location: Right Arm)   Pulse 70   Temp 98 F (36.7 C) (Oral)   Resp (!) 24   Ht 4\' 11"  (1.499 m)   Wt 54.1 kg (119 lb 3.2 oz)   SpO2 100%   BMI 24.08 kg/m  SpO2: SpO2: 100 % O2 Device: O2 Device: Nasal Cannula  O2 Flow Rate: O2 Flow Rate (L/min): 5 L/min  Intake/output summary:   Intake/Output Summary (Last 24 hours) at 08/03/2017 0845 Last data filed at 08/02/2017 1910 Gross per 24 hour  Intake 638.75 ml  Output 300 ml  Net 338.75 ml   LBM: Last BM Date: 07/31/17 Baseline Weight: Weight: 53.5 kg (118 lb) Most recent weight: Weight: 54.1 kg (119 lb 3.2 oz)       Palliative Assessment/Data: 20%      Patient Active Problem List   Diagnosis Date Noted  . HCAP (healthcare-associated pneumonia) 07/31/2017    Palliative Care Assessment & Plan   Patient Profile: Daus a70 y.o.femalewith a known history of breast cancer, hypertension was brought to the emergency room for shortness of breath for the last 1 week and cough.Patient was treated for pneumonia by primary care physician but the symptoms got worse.Patient felt more short of breath for the last 1 week and she was hypoxic when she came to the emergency room she was put on nasal cannula.     Assessment: Melissa Savage appears to have declined since yesterday.     Recommendations/Plan:  Awaiting transfer to facility.  Goals of Care and Additional Recommendations:  Limitations on Scope of Treatment: Comfort measures.  Code Status:    Code Status Orders  (From admission, onward)        Start     Ordered   08/02/17 1719  Do not attempt resuscitation (DNR)  Continuous    Question  Answer Comment  In the event of cardiac or respiratory ARREST Do not call a "code blue"   In the event of cardiac or respiratory ARREST Do not perform Intubation, CPR, defibrillation or ACLS   In the event of cardiac or respiratory ARREST Use medication by any route, position, wound care, and other measures to relive pain and suffering. May use oxygen, suction and manual treatment of airway obstruction as needed for comfort.      08/02/17 1719    Code Status History    Date Active Date Inactive Code Status Order ID Comments User Context   07/31/2017 02:50 08/02/2017 17:19 DNR [id number]  Saundra Shelling, MD ED    Advance Directive Documentation     Most Recent Value  Type of Advance Directive  Living will, Healthcare Power of Attorney  Pre-existing out of facility DNR order (yellow form or pink MOST form)  No data  "MOST" Form in Place?  No data       Prognosis:   < 2 weeks  Discharge Planning:  Hospice facility  Care plan was discussed with Dr. Lavera Guise with Hospice.   Thank you for allowing the Palliative Medicine Team to assist in the care of this patient.   Total Time 35 min Prolonged Time Billed  No       Greater than 50%  of this time was spent counseling and coordinating care related to the above assessment and plan.   Asencion Gowda, NP 08/03/2017 9:18 AM Office: [phone] 7am-7pm  Please see Amion for pager number and availability  Call primary team after hours  Please contact Palliative Medicine Team phone at [phone] for questions and concerns.

## 2017-08-05 LAB — CULTURE, BLOOD (ROUTINE X 2)
CULTURE: NO GROWTH
Culture: NO GROWTH
SPECIAL REQUESTS: ADEQUATE
Special Requests: ADEQUATE

## 2017-09-04 DEATH — deceased

## 2019-02-19 IMAGING — RF DG SWALLOWING FUNCTION - NRPT [facility]
13 of 18 series · 13 of 24 positions shown · non-contrast
Comparison: none

[Series 1: run · 1 of 25 frames shown (1 of 13)]
[frame 4/25]
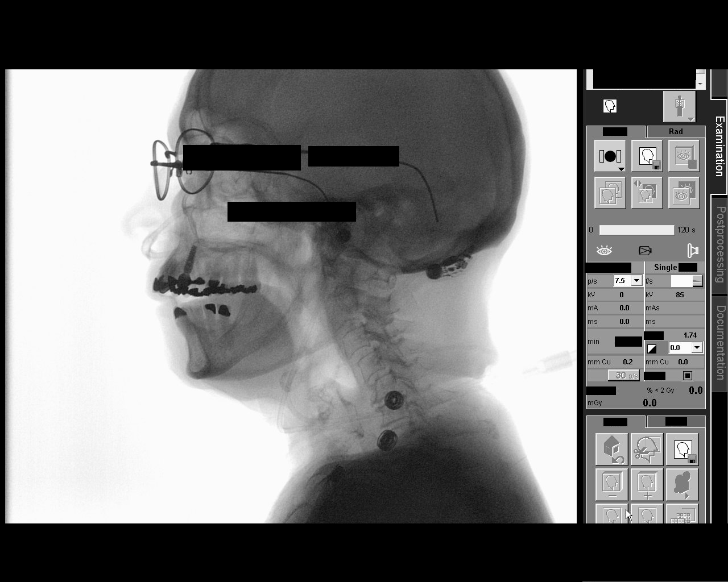

[Series 2: run · 1 of 22 frames shown (2 of 13)]
[frame 20/22]
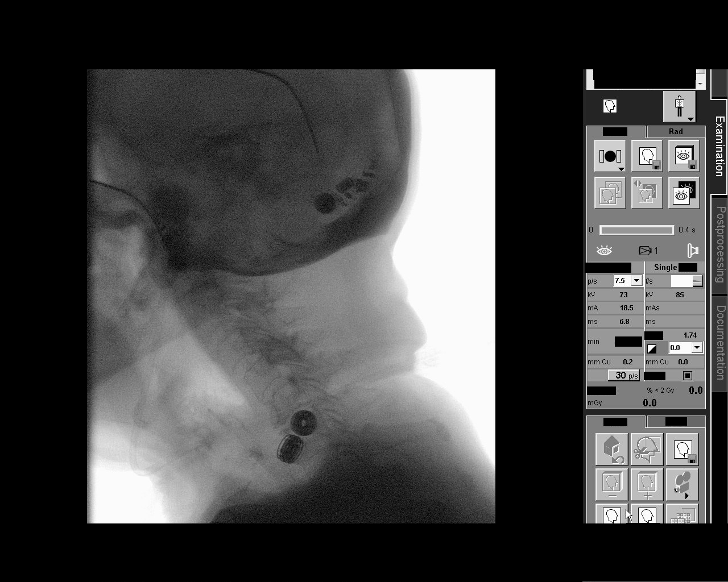

[Series 5: run · 1 of 25 frames shown (3 of 13)]
[frame 22/25]
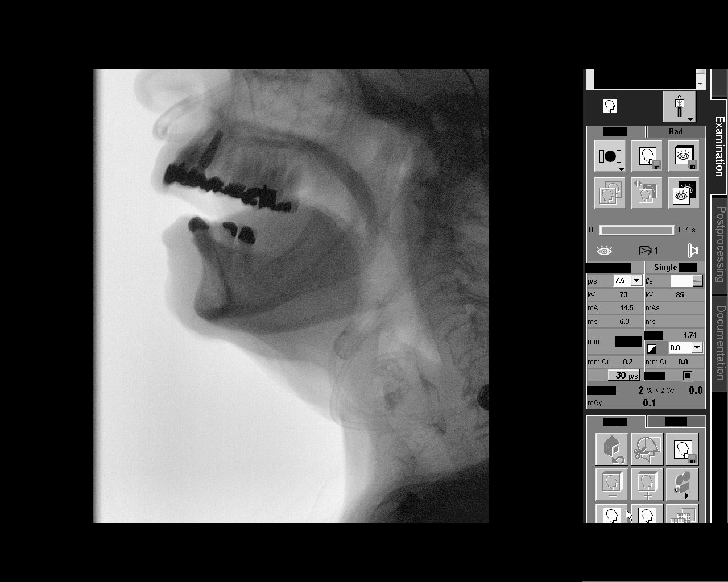

[Series 7: run · 1 of 25 frames shown (4 of 13)]
[frame 4/25]
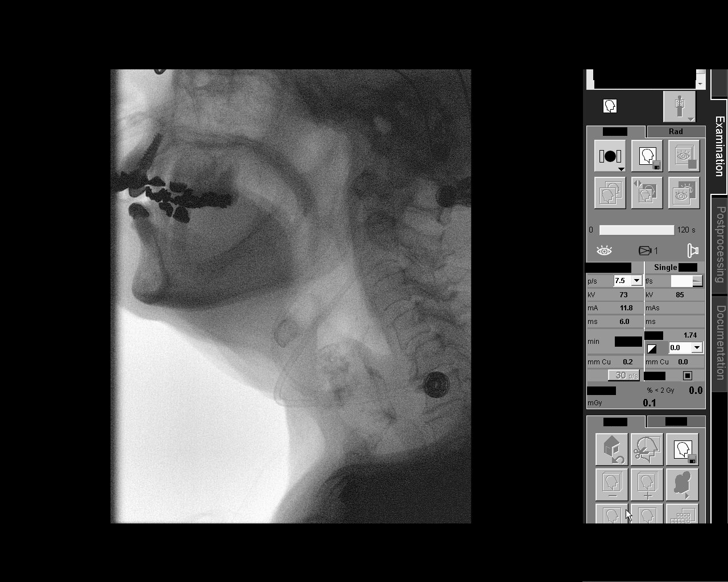

[Series 8: run · 1 of 23 frames shown (5 of 13)]
[frame 20/23]
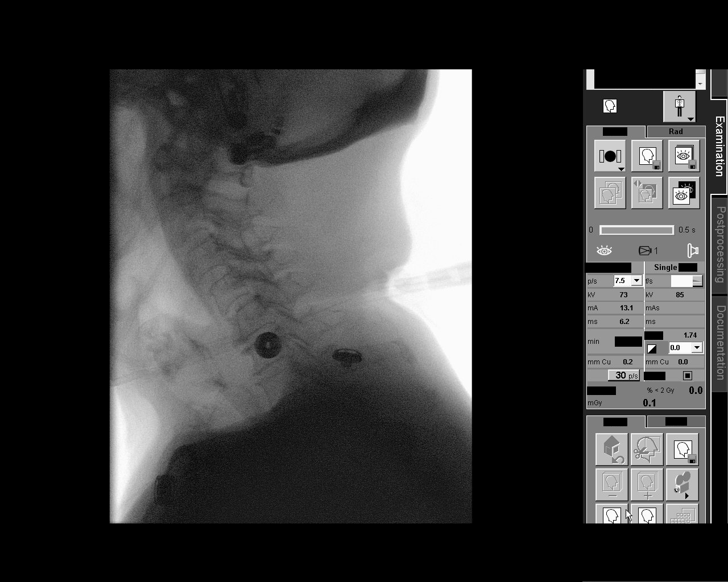

[Series 10: run · 1 of 379 frames shown (6 of 13)]
[frame 40/379]
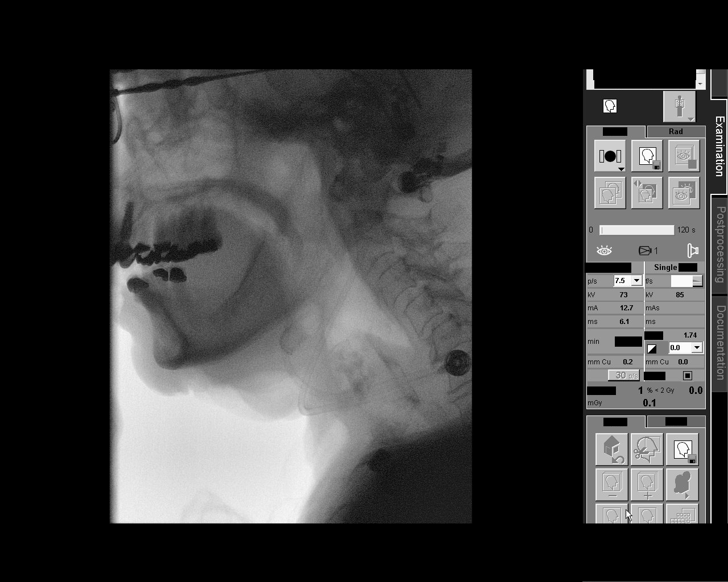

[Series 11: run · 1 of 290 frames shown (7 of 13)]
[frame 44/290]
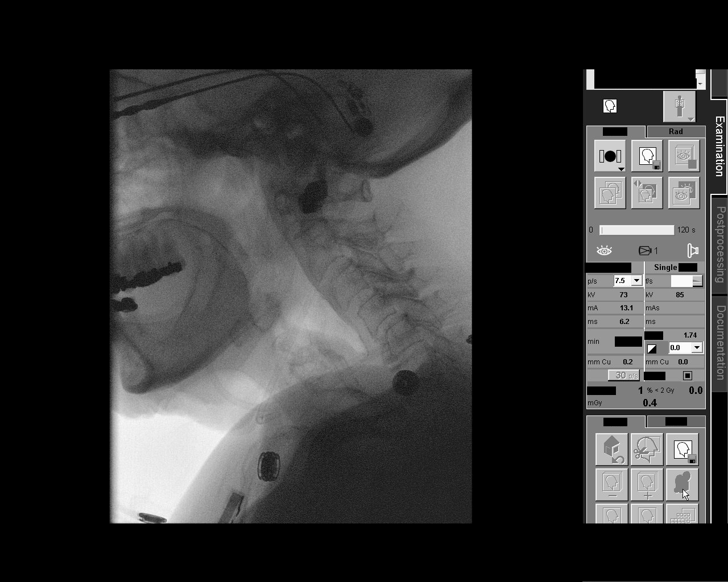

[Series 12: run · 1 of 420 frames shown (8 of 13)]
[frame 15/420]
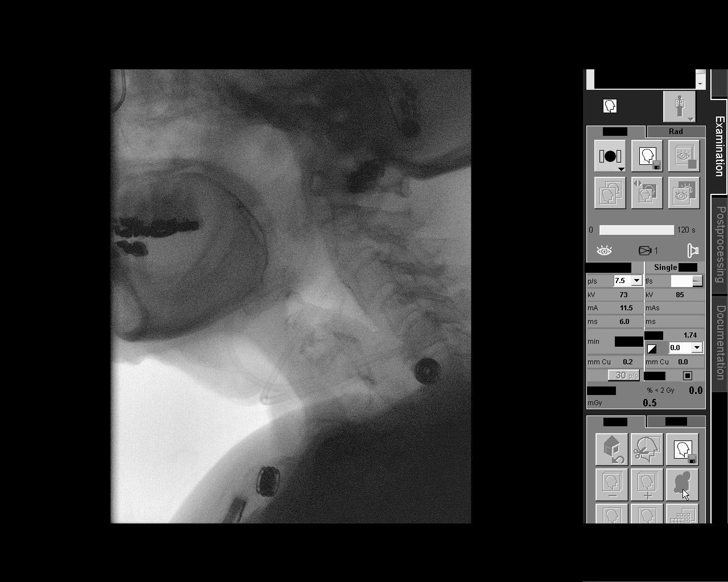

[Series 13: run · 1 of 373 frames shown (9 of 13)]
[frame 187/373]
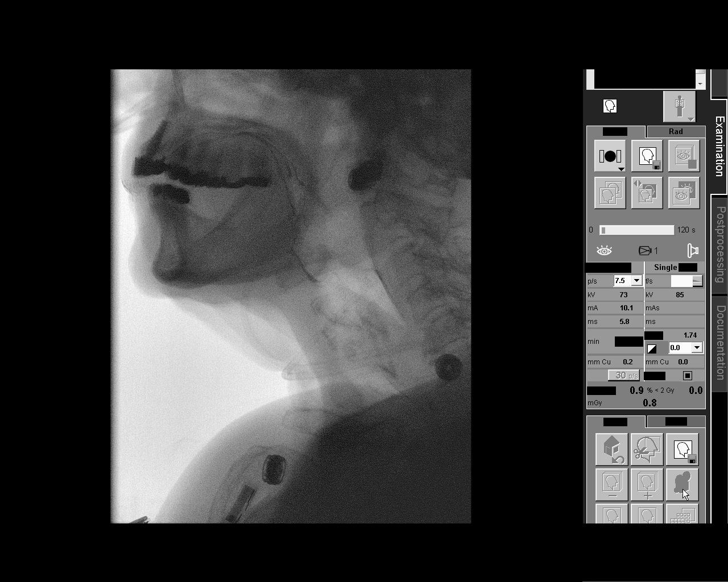

[Series 15: run · 1 of 413 frames shown (10 of 13)]
[frame 62/413]
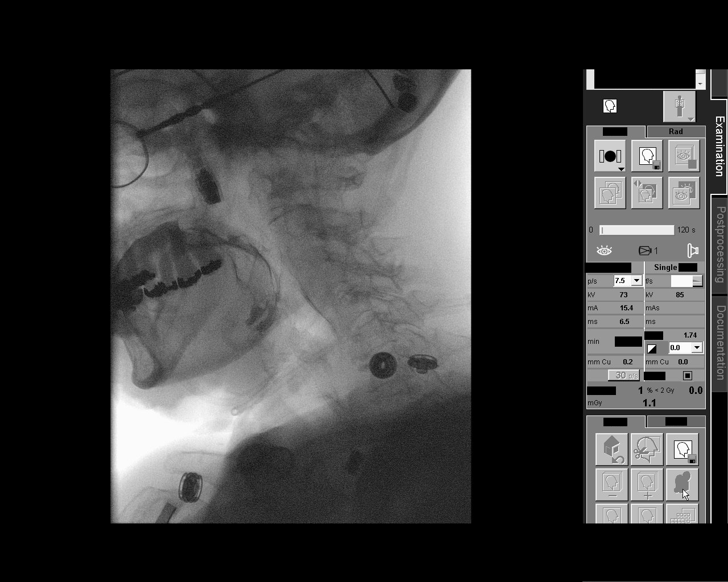

[Series 16: run · 1 of 66 frames shown (11 of 13)]
[frame 36/66]
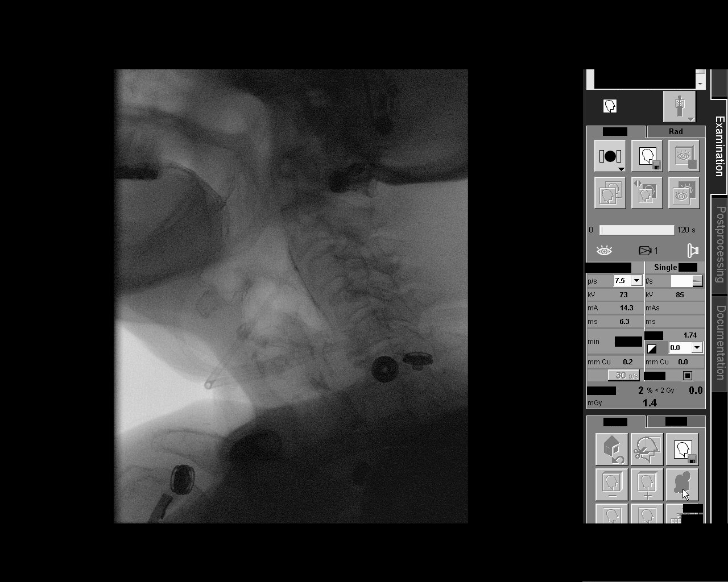

[Series 18: run · 1 of 23 frames shown (12 of 13)]
[frame 4/23]
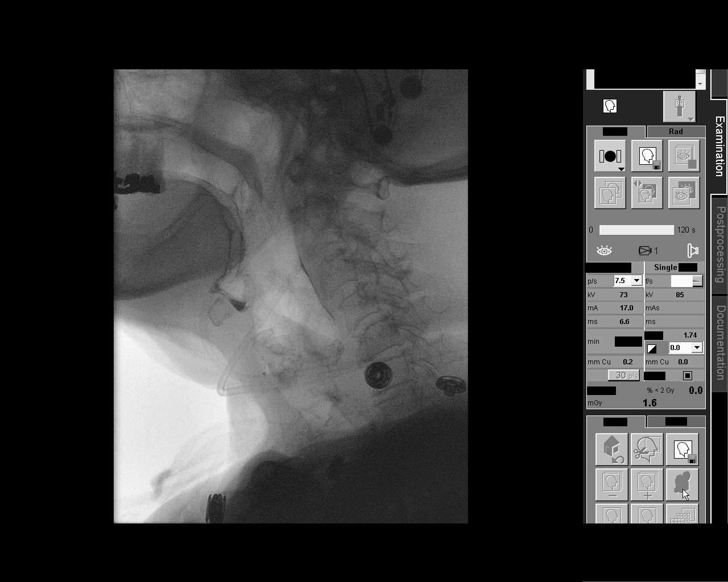

[Series 19: run · 1 of 569 frames shown (13 of 13)]
[frame 568/569]
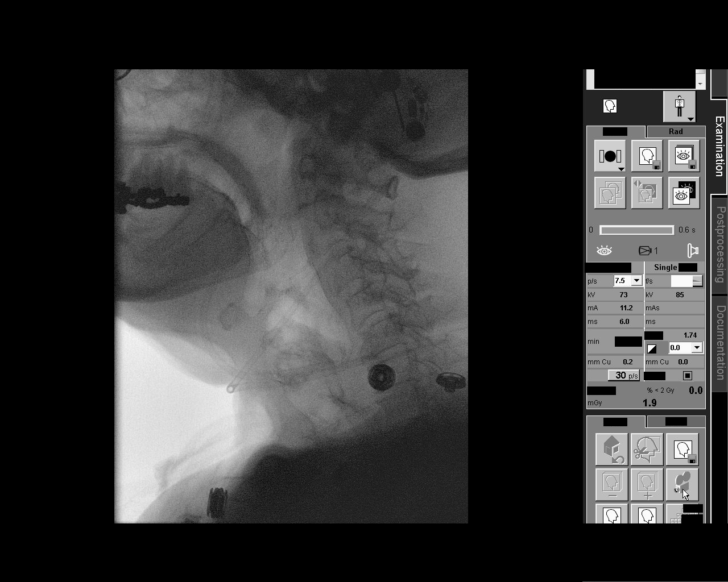

[13 of 24 positions shown; findings below may reference images not displayed]

FLUOROSCOPY FOR SWALLOWING FUNCTION STUDY:
Fluoroscopy was provided for swallowing function study, which was administered by a speech pathologist.  Final results and recommendations from this study are contained within the speech pathology report.
